# Patient Record
Sex: Female | Born: 1975 | Race: White | Hispanic: No | Marital: Married | Smoking: Former smoker
Health system: Southern US, Community
[De-identification: ages and names within clinical notes are randomized; demographics above are authoritative.]

## PROBLEM LIST (undated history)

## (undated) DIAGNOSIS — M329 Systemic lupus erythematosus, unspecified: Secondary | ICD-10-CM

## (undated) DIAGNOSIS — IMO0002 Reserved for concepts with insufficient information to code with codable children: Secondary | ICD-10-CM

## (undated) DIAGNOSIS — O2230 Deep phlebothrombosis in pregnancy, unspecified trimester: Secondary | ICD-10-CM

## (undated) DIAGNOSIS — A64 Unspecified sexually transmitted disease: Secondary | ICD-10-CM

## (undated) DIAGNOSIS — R51 Headache: Secondary | ICD-10-CM

## (undated) HISTORY — PX: COLPOSCOPY: SHX161

## (undated) HISTORY — DX: Headache: R51

## (undated) HISTORY — DX: Deep phlebothrombosis in pregnancy, unspecified trimester: O22.30

## (undated) HISTORY — DX: Unspecified sexually transmitted disease: A64

---

## 1998-02-10 ENCOUNTER — Other Ambulatory Visit: Admission: RE | Admit: 1998-02-10 | Discharge: 1998-02-10 | Payer: Self-pay | Admitting: Gynecology

## 1999-04-19 ENCOUNTER — Other Ambulatory Visit: Admission: RE | Admit: 1999-04-19 | Discharge: 1999-04-19 | Payer: Self-pay | Admitting: Gynecology

## 2000-01-14 ENCOUNTER — Other Ambulatory Visit: Admission: RE | Admit: 2000-01-14 | Discharge: 2000-01-14 | Payer: Self-pay | Admitting: Obstetrics and Gynecology

## 2000-02-22 ENCOUNTER — Encounter (INDEPENDENT_AMBULATORY_CARE_PROVIDER_SITE_OTHER): Payer: Self-pay | Admitting: Specialist

## 2000-02-22 ENCOUNTER — Other Ambulatory Visit: Admission: RE | Admit: 2000-02-22 | Discharge: 2000-02-22 | Payer: Self-pay | Admitting: Gynecology

## 2000-03-16 ENCOUNTER — Ambulatory Visit (HOSPITAL_COMMUNITY): Admission: RE | Admit: 2000-03-16 | Discharge: 2000-03-16 | Payer: Self-pay | Admitting: Gynecology

## 2000-06-20 ENCOUNTER — Other Ambulatory Visit: Admission: RE | Admit: 2000-06-20 | Discharge: 2000-06-20 | Payer: Self-pay | Admitting: Gynecology

## 2000-09-22 ENCOUNTER — Other Ambulatory Visit: Admission: RE | Admit: 2000-09-22 | Discharge: 2000-09-22 | Payer: Self-pay | Admitting: Gynecology

## 2001-01-17 ENCOUNTER — Other Ambulatory Visit: Admission: RE | Admit: 2001-01-17 | Discharge: 2001-01-17 | Payer: Self-pay | Admitting: Gynecology

## 2001-07-31 ENCOUNTER — Other Ambulatory Visit: Admission: RE | Admit: 2001-07-31 | Discharge: 2001-07-31 | Payer: Self-pay | Admitting: Gynecology

## 2001-12-12 ENCOUNTER — Emergency Department (HOSPITAL_COMMUNITY): Admission: EM | Admit: 2001-12-12 | Discharge: 2001-12-12 | Payer: Self-pay | Admitting: Emergency Medicine

## 2002-09-27 ENCOUNTER — Other Ambulatory Visit: Admission: RE | Admit: 2002-09-27 | Discharge: 2002-09-27 | Payer: Self-pay | Admitting: Gynecology

## 2003-05-23 ENCOUNTER — Other Ambulatory Visit: Admission: RE | Admit: 2003-05-23 | Discharge: 2003-05-23 | Payer: Self-pay | Admitting: Gynecology

## 2003-06-09 ENCOUNTER — Emergency Department (HOSPITAL_COMMUNITY): Admission: EM | Admit: 2003-06-09 | Discharge: 2003-06-09 | Payer: Self-pay | Admitting: Emergency Medicine

## 2003-10-20 ENCOUNTER — Other Ambulatory Visit: Admission: RE | Admit: 2003-10-20 | Discharge: 2003-10-20 | Payer: Self-pay | Admitting: Gynecology

## 2004-02-20 ENCOUNTER — Other Ambulatory Visit: Admission: RE | Admit: 2004-02-20 | Discharge: 2004-02-20 | Payer: Self-pay | Admitting: Gynecology

## 2004-08-23 ENCOUNTER — Inpatient Hospital Stay (HOSPITAL_COMMUNITY): Admission: AD | Admit: 2004-08-23 | Discharge: 2004-08-26 | Payer: Self-pay | Admitting: Gynecology

## 2004-10-06 ENCOUNTER — Encounter (INDEPENDENT_AMBULATORY_CARE_PROVIDER_SITE_OTHER): Payer: Self-pay | Admitting: Specialist

## 2004-10-06 ENCOUNTER — Inpatient Hospital Stay (HOSPITAL_COMMUNITY): Admission: RE | Admit: 2004-10-06 | Discharge: 2004-10-09 | Payer: Self-pay | Admitting: Gynecology

## 2004-11-29 ENCOUNTER — Other Ambulatory Visit: Admission: RE | Admit: 2004-11-29 | Discharge: 2004-11-29 | Payer: Self-pay | Admitting: Gynecology

## 2004-11-30 ENCOUNTER — Ambulatory Visit: Payer: Self-pay | Admitting: Oncology

## 2005-02-15 ENCOUNTER — Ambulatory Visit: Payer: Self-pay | Admitting: Oncology

## 2005-04-05 ENCOUNTER — Ambulatory Visit: Payer: Self-pay | Admitting: Oncology

## 2005-05-18 ENCOUNTER — Ambulatory Visit: Payer: Self-pay | Admitting: Oncology

## 2005-05-18 LAB — CBC WITH DIFFERENTIAL/PLATELET
Basophils Absolute: 0 10*3/uL (ref 0.0–0.1)
EOS%: 2.2 % (ref 0.0–7.0)
Eosinophils Absolute: 0.2 10*3/uL (ref 0.0–0.5)
HGB: 11.8 g/dL (ref 11.6–15.9)
MCH: 27.4 pg (ref 26.0–34.0)
NEUT#: 5.9 10*3/uL (ref 1.5–6.5)
RDW: 14.6 % — ABNORMAL HIGH (ref 11.3–14.5)
lymph#: 1.3 10*3/uL (ref 0.9–3.3)

## 2005-05-24 ENCOUNTER — Inpatient Hospital Stay (HOSPITAL_COMMUNITY): Admission: EM | Admit: 2005-05-24 | Discharge: 2005-05-26 | Payer: Self-pay | Admitting: Emergency Medicine

## 2005-05-24 ENCOUNTER — Encounter (INDEPENDENT_AMBULATORY_CARE_PROVIDER_SITE_OTHER): Payer: Self-pay | Admitting: Cardiology

## 2005-05-25 ENCOUNTER — Ambulatory Visit: Payer: Self-pay | Admitting: Oncology

## 2005-06-01 ENCOUNTER — Ambulatory Visit: Payer: Self-pay | Admitting: Internal Medicine

## 2005-06-09 ENCOUNTER — Ambulatory Visit: Payer: Self-pay | Admitting: Family Medicine

## 2005-06-09 ENCOUNTER — Ambulatory Visit: Payer: Self-pay | Admitting: Cardiovascular Disease

## 2005-06-09 ENCOUNTER — Encounter: Admission: RE | Admit: 2005-06-09 | Discharge: 2005-06-09 | Payer: Self-pay | Admitting: Cardiovascular Disease

## 2005-06-10 ENCOUNTER — Ambulatory Visit: Payer: Self-pay | Admitting: Family Medicine

## 2005-06-17 LAB — PROTHROMBIN TIME
INR: 5 — ABNORMAL HIGH (ref 0.0–1.5)
Prothrombin Time: 46.2 seconds — ABNORMAL HIGH (ref 11.6–15.2)

## 2005-06-17 LAB — PROTIME-INR

## 2005-06-22 LAB — CHROMOGENIC FACTOR X: CHROM XA: 19.2 %

## 2005-07-05 ENCOUNTER — Ambulatory Visit: Payer: Self-pay | Admitting: Oncology

## 2005-07-05 LAB — CBC WITH DIFFERENTIAL/PLATELET
Basophils Absolute: 0.1 10*3/uL (ref 0.0–0.1)
Eosinophils Absolute: 0.1 10*3/uL (ref 0.0–0.5)
HCT: 36.6 % (ref 34.8–46.6)
HGB: 11.8 g/dL (ref 11.6–15.9)
MONO#: 0.6 10*3/uL (ref 0.1–0.9)
NEUT%: 84 % — ABNORMAL HIGH (ref 39.6–76.8)
WBC: 14 10*3/uL — ABNORMAL HIGH (ref 3.9–10.0)
lymph#: 1.5 10*3/uL (ref 0.9–3.3)

## 2005-07-05 LAB — PROTIME-INR

## 2005-07-11 LAB — CHROMOGENIC FACTOR X: CHROM XA: 26.4 %

## 2005-07-29 LAB — CBC WITH DIFFERENTIAL/PLATELET
BASO%: 0.6 % (ref 0.0–2.0)
Basophils Absolute: 0 10*3/uL (ref 0.0–0.1)
Eosinophils Absolute: 0.1 10*3/uL (ref 0.0–0.5)
HCT: 36.4 % (ref 34.8–46.6)
HGB: 12.2 g/dL (ref 11.6–15.9)
LYMPH%: 23.9 % (ref 14.0–48.0)
MCHC: 33.5 g/dL (ref 32.0–36.0)
MONO#: 0.6 10*3/uL (ref 0.1–0.9)
NEUT%: 66.7 % (ref 39.6–76.8)
Platelets: 445 10*3/uL — ABNORMAL HIGH (ref 145–400)
WBC: 8.4 10*3/uL (ref 3.9–10.0)
lymph#: 2 10*3/uL (ref 0.9–3.3)

## 2005-08-05 LAB — PROTIME-INR

## 2005-08-09 LAB — PROTIME-INR
INR: 1.8 — ABNORMAL LOW (ref 2.00–3.50)
Protime: 16.5 Seconds — ABNORMAL HIGH (ref 10.6–13.4)

## 2005-08-22 ENCOUNTER — Ambulatory Visit: Payer: Self-pay | Admitting: Oncology

## 2005-08-22 LAB — PROTIME-INR

## 2005-08-23 LAB — CHROMOGENIC FACTOR X: CHROM XA: 27.9 %

## 2005-09-09 ENCOUNTER — Ambulatory Visit: Payer: Self-pay | Admitting: Internal Medicine

## 2005-09-14 LAB — CHROMOGENIC FACTOR X: CHROM XA: 42.2 %

## 2005-09-27 ENCOUNTER — Ambulatory Visit: Payer: Self-pay | Admitting: Internal Medicine

## 2005-10-05 ENCOUNTER — Ambulatory Visit: Payer: Self-pay | Admitting: Oncology

## 2005-10-07 LAB — CHROMOGENIC FACTOR X: CHROM XA: 44.3 %

## 2005-10-24 LAB — CHROMOGENIC FACTOR X: CHROM XA: 44.6 %

## 2005-11-01 LAB — CBC WITH DIFFERENTIAL/PLATELET
BASO%: 0.5 % (ref 0.0–2.0)
Basophils Absolute: 0.1 10*3/uL (ref 0.0–0.1)
EOS%: 0.9 % (ref 0.0–7.0)
HCT: 38.8 % (ref 34.8–46.6)
HGB: 13 g/dL (ref 11.6–15.9)
MCH: 27.4 pg (ref 26.0–34.0)
MCHC: 33.5 g/dL (ref 32.0–36.0)
MONO#: 0.5 10*3/uL (ref 0.1–0.9)
NEUT%: 82.4 % — ABNORMAL HIGH (ref 39.6–76.8)
RDW: 15 % — ABNORMAL HIGH (ref 11.3–14.5)
WBC: 10.8 10*3/uL — ABNORMAL HIGH (ref 3.9–10.0)
lymph#: 1.2 10*3/uL (ref 0.9–3.3)

## 2005-11-24 ENCOUNTER — Ambulatory Visit: Payer: Self-pay | Admitting: Oncology

## 2005-12-19 LAB — PROTHROMBIN TIME: Prothrombin Time: 31.1 seconds — ABNORMAL HIGH (ref 11.6–15.2)

## 2005-12-20 LAB — CHROMOGENIC FACTOR X: CHROM XA: 19.6 % — ABNORMAL LOW (ref 86–146)

## 2005-12-21 ENCOUNTER — Other Ambulatory Visit: Admission: RE | Admit: 2005-12-21 | Discharge: 2005-12-21 | Payer: Self-pay | Admitting: Gynecology

## 2005-12-26 ENCOUNTER — Ambulatory Visit: Payer: Self-pay

## 2005-12-26 ENCOUNTER — Encounter: Payer: Self-pay | Admitting: Internal Medicine

## 2005-12-27 ENCOUNTER — Ambulatory Visit: Payer: Self-pay | Admitting: Internal Medicine

## 2006-01-02 LAB — PROTIME-INR

## 2006-01-03 ENCOUNTER — Ambulatory Visit: Payer: Self-pay

## 2006-01-05 ENCOUNTER — Ambulatory Visit: Payer: Self-pay

## 2006-01-12 ENCOUNTER — Ambulatory Visit: Payer: Self-pay | Admitting: Oncology

## 2006-01-16 LAB — PROTHROMBIN TIME: INR: 2.1 — ABNORMAL HIGH (ref 0.0–1.5)

## 2006-01-17 LAB — CHROMOGENIC FACTOR X: CHROM XA: 24.4 % — ABNORMAL LOW (ref 86–146)

## 2006-02-09 LAB — CHROMOGENIC FACTOR X: CHROM XA: 28.1 % — ABNORMAL LOW (ref 86–146)

## 2006-02-17 LAB — PROTHROMBIN TIME
INR: 1.9 — ABNORMAL HIGH (ref 0.0–1.5)
Prothrombin Time: 23 seconds — ABNORMAL HIGH (ref 11.6–15.2)

## 2006-02-20 LAB — CHROMOGENIC FACTOR X: CHROM XA: 32.7 % — ABNORMAL LOW (ref 86–146)

## 2006-03-07 ENCOUNTER — Ambulatory Visit: Payer: Self-pay | Admitting: Oncology

## 2006-03-10 LAB — PROTHROMBIN TIME: INR: 2.8 — ABNORMAL HIGH (ref 0.0–1.5)

## 2006-03-10 LAB — CHROMOGENIC FACTOR X: CHROM XA: 21.1 % — ABNORMAL LOW (ref 86–146)

## 2006-04-07 LAB — CBC WITH DIFFERENTIAL/PLATELET
BASO%: 0.6 % (ref 0.0–2.0)
LYMPH%: 26.7 % (ref 14.0–48.0)
MCHC: 34.3 g/dL (ref 32.0–36.0)
MONO#: 0.4 10*3/uL (ref 0.1–0.9)
Platelets: 317 10*3/uL (ref 145–400)
RBC: 4.69 10*6/uL (ref 3.70–5.32)
WBC: 5.9 10*3/uL (ref 3.9–10.0)
lymph#: 1.6 10*3/uL (ref 0.9–3.3)

## 2006-04-07 LAB — PROTIME-INR: Protime: 26.4 Seconds — ABNORMAL HIGH (ref 10.6–13.4)

## 2006-04-11 LAB — CHROMOGENIC FACTOR X: CHROM XA: 30 % — ABNORMAL LOW (ref 86–146)

## 2006-05-03 ENCOUNTER — Ambulatory Visit: Payer: Self-pay | Admitting: Oncology

## 2006-05-09 LAB — CBC WITH DIFFERENTIAL/PLATELET
BASO%: 0.5 % (ref 0.0–2.0)
EOS%: 3 % (ref 0.0–7.0)
HCT: 36.1 % (ref 34.8–46.6)
LYMPH%: 24.5 % (ref 14.0–48.0)
MCH: 28.4 pg (ref 26.0–34.0)
MCHC: 34.4 g/dL (ref 32.0–36.0)
MCV: 82.5 fL (ref 81.0–101.0)
MONO%: 9 % (ref 0.0–13.0)
NEUT%: 63 % (ref 39.6–76.8)
Platelets: 331 10*3/uL (ref 145–400)
lymph#: 1.3 10*3/uL (ref 0.9–3.3)

## 2006-05-09 LAB — PROTIME-INR
INR: 3.5 (ref 2.00–3.50)
Protime: 42 s — ABNORMAL HIGH (ref 10.6–13.4)

## 2006-05-09 LAB — CHROMOGENIC FACTOR X: CHROM XA: 22.7 % — ABNORMAL LOW (ref 86–146)

## 2006-05-23 LAB — PROTIME-INR

## 2006-05-30 LAB — CHROMOGENIC FACTOR X: CHROM XA: 30 % — ABNORMAL LOW (ref 86–146)

## 2006-06-08 LAB — CBC WITH DIFFERENTIAL/PLATELET
BASO%: 0.4 % (ref 0.0–2.0)
Basophils Absolute: 0 10*3/uL (ref 0.0–0.1)
EOS%: 1.9 % (ref 0.0–7.0)
HCT: 34 % — ABNORMAL LOW (ref 34.8–46.6)
HGB: 11.9 g/dL (ref 11.6–15.9)
MCH: 29 pg (ref 26.0–34.0)
MONO#: 0.6 10*3/uL (ref 0.1–0.9)
RDW: 14.3 % (ref 11.3–14.5)
WBC: 6.5 10*3/uL (ref 3.9–10.0)
lymph#: 1.6 10*3/uL (ref 0.9–3.3)

## 2006-06-08 LAB — PROTIME-INR: INR: 2.6 (ref 2.00–3.50)

## 2006-07-05 ENCOUNTER — Ambulatory Visit: Payer: Self-pay | Admitting: Oncology

## 2006-07-07 LAB — CBC WITH DIFFERENTIAL/PLATELET
Eosinophils Absolute: 0.1 10*3/uL (ref 0.0–0.5)
HCT: 35.4 % (ref 34.8–46.6)
LYMPH%: 21.7 % (ref 14.0–48.0)
MCHC: 35.1 g/dL (ref 32.0–36.0)
MCV: 83.5 fL (ref 81.0–101.0)
MONO#: 0.4 10*3/uL (ref 0.1–0.9)
MONO%: 6.9 % (ref 0.0–13.0)
NEUT%: 68.6 % (ref 39.6–76.8)
Platelets: 327 10*3/uL (ref 145–400)
RBC: 4.24 10*6/uL (ref 3.70–5.32)
WBC: 6 10*3/uL (ref 3.9–10.0)

## 2006-07-07 LAB — PROTIME-INR: Protime: 48 Seconds — ABNORMAL HIGH (ref 10.6–13.4)

## 2006-07-19 LAB — PROTIME-INR
INR: 2.8 (ref 2.00–3.50)
Protime: 33.6 Seconds — ABNORMAL HIGH (ref 10.6–13.4)

## 2006-08-14 ENCOUNTER — Ambulatory Visit: Payer: Self-pay | Admitting: Oncology

## 2006-08-16 LAB — CBC WITH DIFFERENTIAL/PLATELET
Basophils Absolute: 0.1 10*3/uL (ref 0.0–0.1)
Eosinophils Absolute: 0.1 10*3/uL (ref 0.0–0.5)
HGB: 12.6 g/dL (ref 11.6–15.9)
MONO#: 0.7 10*3/uL (ref 0.1–0.9)
NEUT#: 7.1 10*3/uL — ABNORMAL HIGH (ref 1.5–6.5)
RBC: 4.23 10*6/uL (ref 3.70–5.32)
RDW: 11 % — ABNORMAL LOW (ref 11.3–14.5)
WBC: 9.5 10*3/uL (ref 3.9–10.0)
lymph#: 1.5 10*3/uL (ref 0.9–3.3)

## 2006-08-16 LAB — PROTIME-INR
INR: 1.9 — ABNORMAL LOW (ref 2.00–3.50)
Protime: 22.8 Seconds — ABNORMAL HIGH (ref 10.6–13.4)

## 2006-11-02 ENCOUNTER — Encounter: Payer: Self-pay | Admitting: Internal Medicine

## 2006-11-22 ENCOUNTER — Ambulatory Visit: Payer: Self-pay | Admitting: Oncology

## 2007-01-08 ENCOUNTER — Other Ambulatory Visit: Admission: RE | Admit: 2007-01-08 | Discharge: 2007-01-08 | Payer: Self-pay | Admitting: Gynecology

## 2007-02-02 ENCOUNTER — Ambulatory Visit: Payer: Self-pay | Admitting: Oncology

## 2007-02-19 LAB — CBC WITH DIFFERENTIAL/PLATELET
Basophils Absolute: 0 10*3/uL (ref 0.0–0.1)
EOS%: 0.5 % (ref 0.0–7.0)
HGB: 12.5 g/dL (ref 11.6–15.9)
LYMPH%: 8.4 % — ABNORMAL LOW (ref 14.0–48.0)
MCH: 29 pg (ref 26.0–34.0)
MCV: 84.7 fL (ref 81.0–101.0)
MONO%: 3.5 % (ref 0.0–13.0)
RBC: 4.31 10*6/uL (ref 3.70–5.32)
RDW: 12.8 % (ref 11.3–14.5)

## 2007-02-19 LAB — PROTHROMBIN TIME
INR: 3.6 — ABNORMAL HIGH (ref 0.0–1.5)
Prothrombin Time: 36.9 seconds — ABNORMAL HIGH (ref 11.6–15.2)

## 2007-02-19 LAB — COMPREHENSIVE METABOLIC PANEL
Alkaline Phosphatase: 51 U/L (ref 39–117)
Creatinine, Ser: 0.9 mg/dL (ref 0.40–1.20)
Glucose, Bld: 124 mg/dL — ABNORMAL HIGH (ref 70–99)
Sodium: 140 mEq/L (ref 135–145)
Total Bilirubin: 0.5 mg/dL (ref 0.3–1.2)
Total Protein: 7.4 g/dL (ref 6.0–8.3)

## 2007-08-08 ENCOUNTER — Ambulatory Visit: Payer: Self-pay | Admitting: Oncology

## 2007-10-29 ENCOUNTER — Ambulatory Visit: Payer: Self-pay | Admitting: Internal Medicine

## 2007-10-29 DIAGNOSIS — I319 Disease of pericardium, unspecified: Secondary | ICD-10-CM | POA: Insufficient documentation

## 2007-10-29 DIAGNOSIS — R609 Edema, unspecified: Secondary | ICD-10-CM | POA: Insufficient documentation

## 2007-10-29 DIAGNOSIS — Z8672 Personal history of thrombophlebitis: Secondary | ICD-10-CM | POA: Insufficient documentation

## 2007-10-29 DIAGNOSIS — M329 Systemic lupus erythematosus, unspecified: Secondary | ICD-10-CM | POA: Insufficient documentation

## 2007-11-08 ENCOUNTER — Ambulatory Visit: Payer: Self-pay | Admitting: Internal Medicine

## 2008-01-21 ENCOUNTER — Ambulatory Visit: Payer: Self-pay | Admitting: Women's Health

## 2008-01-21 ENCOUNTER — Encounter: Payer: Self-pay | Admitting: Women's Health

## 2008-01-21 ENCOUNTER — Other Ambulatory Visit: Admission: RE | Admit: 2008-01-21 | Discharge: 2008-01-21 | Payer: Self-pay | Admitting: Gynecology

## 2008-01-22 ENCOUNTER — Encounter: Payer: Self-pay | Admitting: Internal Medicine

## 2008-02-21 ENCOUNTER — Ambulatory Visit: Payer: Self-pay | Admitting: Oncology

## 2008-03-19 ENCOUNTER — Ambulatory Visit: Payer: Self-pay | Admitting: Women's Health

## 2008-03-27 ENCOUNTER — Ambulatory Visit: Payer: Self-pay | Admitting: Gynecology

## 2008-03-27 ENCOUNTER — Encounter: Payer: Self-pay | Admitting: Gynecology

## 2008-05-16 ENCOUNTER — Emergency Department (HOSPITAL_COMMUNITY): Admission: EM | Admit: 2008-05-16 | Discharge: 2008-05-17 | Payer: Self-pay | Admitting: Emergency Medicine

## 2008-05-19 ENCOUNTER — Ambulatory Visit: Payer: Self-pay | Admitting: Family Medicine

## 2008-05-19 DIAGNOSIS — L02419 Cutaneous abscess of limb, unspecified: Secondary | ICD-10-CM | POA: Insufficient documentation

## 2008-05-19 DIAGNOSIS — L03119 Cellulitis of unspecified part of limb: Secondary | ICD-10-CM

## 2008-05-22 ENCOUNTER — Ambulatory Visit: Payer: Self-pay | Admitting: Family Medicine

## 2008-05-26 ENCOUNTER — Ambulatory Visit: Payer: Self-pay | Admitting: Family Medicine

## 2008-06-09 ENCOUNTER — Ambulatory Visit: Payer: Self-pay | Admitting: Oncology

## 2008-06-17 LAB — PROTIME-INR: INR: 1.7 — ABNORMAL LOW (ref 2.00–3.50)

## 2008-07-16 LAB — PROTIME-INR
INR: 4.3 — ABNORMAL HIGH (ref 2.00–3.50)
Protime: 51.6 Seconds — ABNORMAL HIGH (ref 10.6–13.4)

## 2008-08-21 ENCOUNTER — Ambulatory Visit: Payer: Self-pay | Admitting: Oncology

## 2008-09-11 LAB — COMPREHENSIVE METABOLIC PANEL
Albumin: 4 g/dL (ref 3.5–5.2)
BUN: 10 mg/dL (ref 6–23)
CO2: 25 mEq/L (ref 19–32)
Calcium: 9.1 mg/dL (ref 8.4–10.5)
Chloride: 103 mEq/L (ref 96–112)
Glucose, Bld: 90 mg/dL (ref 70–99)
Potassium: 4.5 mEq/L (ref 3.5–5.3)

## 2008-09-11 LAB — CBC WITH DIFFERENTIAL/PLATELET
Basophils Absolute: 0 10*3/uL (ref 0.0–0.1)
Eosinophils Absolute: 0 10*3/uL (ref 0.0–0.5)
HCT: 37.9 % (ref 34.8–46.6)
HGB: 12.8 g/dL (ref 11.6–15.9)
LYMPH%: 8.8 % — ABNORMAL LOW (ref 14.0–49.7)
MCHC: 33.7 g/dL (ref 31.5–36.0)
MONO#: 0.5 10*3/uL (ref 0.1–0.9)
NEUT#: 8.5 10*3/uL — ABNORMAL HIGH (ref 1.5–6.5)
NEUT%: 85.4 % — ABNORMAL HIGH (ref 38.4–76.8)
Platelets: 340 10*3/uL (ref 145–400)
WBC: 9.9 10*3/uL (ref 3.9–10.3)

## 2008-09-11 LAB — ERYTHROCYTE SEDIMENTATION RATE: Sed Rate: 33 mm/hr — ABNORMAL HIGH (ref 0–30)

## 2008-09-11 LAB — PROTIME-INR: Protime: 31.2 Seconds — ABNORMAL HIGH (ref 10.6–13.4)

## 2008-09-11 LAB — LACTATE DEHYDROGENASE: LDH: 191 U/L (ref 94–250)

## 2008-09-11 LAB — MORPHOLOGY: PLT EST: ADEQUATE

## 2008-10-01 ENCOUNTER — Encounter: Payer: Self-pay | Admitting: Internal Medicine

## 2008-11-24 ENCOUNTER — Ambulatory Visit: Payer: Self-pay | Admitting: Oncology

## 2008-11-24 LAB — CBC WITH DIFFERENTIAL/PLATELET
Eosinophils Absolute: 0.1 10*3/uL (ref 0.0–0.5)
LYMPH%: 23.4 % (ref 14.0–49.7)
MCV: 87.7 fL (ref 79.5–101.0)
MONO%: 8.2 % (ref 0.0–14.0)
NEUT#: 5.4 10*3/uL (ref 1.5–6.5)
NEUT%: 66.7 % (ref 38.4–76.8)
Platelets: 317 10*3/uL (ref 145–400)
RBC: 4.16 10*6/uL (ref 3.70–5.45)
nRBC: 0 % (ref 0–0)

## 2008-11-24 LAB — PROTIME-INR: Protime: 22.8 Seconds — ABNORMAL HIGH (ref 10.6–13.4)

## 2009-01-16 ENCOUNTER — Ambulatory Visit: Payer: Self-pay | Admitting: Oncology

## 2009-01-20 LAB — PROTIME-INR: Protime: 20.4 Seconds — ABNORMAL HIGH (ref 10.6–13.4)

## 2009-01-28 LAB — PROTIME-INR
INR: 2 (ref 2.00–3.50)
Protime: 24 Seconds — ABNORMAL HIGH (ref 10.6–13.4)

## 2009-02-04 ENCOUNTER — Ambulatory Visit: Payer: Self-pay | Admitting: Women's Health

## 2009-02-04 ENCOUNTER — Other Ambulatory Visit: Admission: RE | Admit: 2009-02-04 | Discharge: 2009-02-04 | Payer: Self-pay | Admitting: Gynecology

## 2009-03-06 ENCOUNTER — Ambulatory Visit: Payer: Self-pay | Admitting: Oncology

## 2009-03-10 LAB — CBC WITH DIFFERENTIAL/PLATELET
BASO%: 0.6 % (ref 0.0–2.0)
Basophils Absolute: 0.1 10*3/uL (ref 0.0–0.1)
EOS%: 0.8 % (ref 0.0–7.0)
Eosinophils Absolute: 0.1 10*3/uL (ref 0.0–0.5)
HCT: 37.2 % (ref 34.8–46.6)
HGB: 12.4 g/dL (ref 11.6–15.9)
MCV: 86.9 fL (ref 79.5–101.0)
MONO%: 5.7 % (ref 0.0–14.0)
Platelets: 351 10*3/uL (ref 145–400)
RBC: 4.28 10*6/uL (ref 3.70–5.45)
RDW: 13.3 % (ref 11.2–14.5)
lymph#: 1.2 10*3/uL (ref 0.9–3.3)

## 2009-03-10 LAB — PROTIME-INR: Protime: 43.2 Seconds — ABNORMAL HIGH (ref 10.6–13.4)

## 2009-04-01 LAB — CHROMOGENIC FACTOR X: CHROM XA: 16.3 % — ABNORMAL LOW (ref 86–146)

## 2009-06-09 ENCOUNTER — Ambulatory Visit: Payer: Self-pay | Admitting: Family Medicine

## 2009-06-12 ENCOUNTER — Ambulatory Visit: Payer: Self-pay | Admitting: Oncology

## 2009-06-18 ENCOUNTER — Ambulatory Visit: Payer: Self-pay | Admitting: Women's Health

## 2009-06-18 LAB — CBC WITH DIFFERENTIAL/PLATELET
BASO%: 0.6 % (ref 0.0–2.0)
Basophils Absolute: 0 10*3/uL (ref 0.0–0.1)
HCT: 35.4 % (ref 34.8–46.6)
HGB: 12.2 g/dL (ref 11.6–15.9)
LYMPH%: 17.1 % (ref 14.0–49.7)
MONO#: 0.5 10*3/uL (ref 0.1–0.9)
MONO%: 7.2 % (ref 0.0–14.0)
NEUT%: 73.9 % (ref 38.4–76.8)

## 2009-06-18 LAB — PROTIME-INR: INR: 3.9 — ABNORMAL HIGH (ref 2.00–3.50)

## 2009-07-07 LAB — PROTIME-INR: INR: 3.8 — ABNORMAL HIGH (ref 2.00–3.50)

## 2009-07-15 ENCOUNTER — Ambulatory Visit: Payer: Self-pay | Admitting: Oncology

## 2009-09-04 ENCOUNTER — Ambulatory Visit: Payer: Self-pay | Admitting: Oncology

## 2009-09-08 LAB — CBC WITH DIFFERENTIAL/PLATELET
Basophils Absolute: 0 10*3/uL (ref 0.0–0.1)
EOS%: 2.5 % (ref 0.0–7.0)
HCT: 36.8 % (ref 34.8–46.6)
HGB: 12.2 g/dL (ref 11.6–15.9)
MCH: 29 pg (ref 25.1–34.0)
MCHC: 33.2 g/dL (ref 31.5–36.0)
MCV: 87.6 fL (ref 79.5–101.0)
MONO%: 9.3 % (ref 0.0–14.0)
NEUT#: 5 10*3/uL (ref 1.5–6.5)
RDW: 13.1 % (ref 11.2–14.5)
WBC: 7.3 10*3/uL (ref 3.9–10.3)
lymph#: 1.5 10*3/uL (ref 0.9–3.3)

## 2009-09-08 LAB — PROTIME-INR: INR: 2.1 (ref 2.00–3.50)

## 2009-09-12 ENCOUNTER — Emergency Department (HOSPITAL_COMMUNITY): Admission: EM | Admit: 2009-09-12 | Discharge: 2009-09-12 | Payer: Self-pay | Admitting: Emergency Medicine

## 2009-11-23 ENCOUNTER — Encounter: Admission: RE | Admit: 2009-11-23 | Discharge: 2009-11-23 | Payer: Self-pay | Admitting: Internal Medicine

## 2009-12-28 ENCOUNTER — Ambulatory Visit: Payer: Self-pay | Admitting: Oncology

## 2009-12-28 LAB — PROTIME-INR: Protime: 20.4 Seconds — ABNORMAL HIGH (ref 10.6–13.4)

## 2010-01-06 ENCOUNTER — Ambulatory Visit: Payer: Self-pay | Admitting: Women's Health

## 2010-01-19 LAB — PROTIME-INR: Protime: 24 Seconds — ABNORMAL HIGH (ref 10.6–13.4)

## 2010-02-17 ENCOUNTER — Ambulatory Visit: Payer: Self-pay | Admitting: Oncology

## 2010-02-17 LAB — PROTIME-INR
INR: 2.3 (ref 2.00–3.50)
Protime: 27.6 Seconds — ABNORMAL HIGH (ref 10.6–13.4)

## 2010-03-03 ENCOUNTER — Ambulatory Visit
Admission: RE | Admit: 2010-03-03 | Discharge: 2010-03-03 | Payer: Self-pay | Source: Home / Self Care | Attending: Women's Health | Admitting: Women's Health

## 2010-03-03 ENCOUNTER — Other Ambulatory Visit
Admission: RE | Admit: 2010-03-03 | Discharge: 2010-03-03 | Payer: Self-pay | Source: Home / Self Care | Admitting: Gynecology

## 2010-03-16 ENCOUNTER — Encounter (HOSPITAL_BASED_OUTPATIENT_CLINIC_OR_DEPARTMENT_OTHER): Payer: Self-pay | Admitting: Oncology

## 2010-03-16 ENCOUNTER — Other Ambulatory Visit: Payer: Self-pay | Admitting: Oncology

## 2010-03-16 DIAGNOSIS — Z7901 Long term (current) use of anticoagulants: Secondary | ICD-10-CM

## 2010-03-16 DIAGNOSIS — Z86718 Personal history of other venous thrombosis and embolism: Secondary | ICD-10-CM

## 2010-03-16 DIAGNOSIS — M329 Systemic lupus erythematosus, unspecified: Secondary | ICD-10-CM

## 2010-03-16 LAB — CBC WITH DIFFERENTIAL/PLATELET
BASO%: 0.4 % (ref 0.0–2.0)
Basophils Absolute: 0.1 10*3/uL (ref 0.0–0.1)
LYMPH%: 9.6 % — ABNORMAL LOW (ref 14.0–49.7)
MCH: 29.1 pg (ref 25.1–34.0)
MCHC: 33.6 g/dL (ref 31.5–36.0)
MCV: 86.7 fL (ref 79.5–101.0)
MONO#: 0.7 10*3/uL (ref 0.1–0.9)
MONO%: 5.6 % (ref 0.0–14.0)
NEUT#: 10.8 10*3/uL — ABNORMAL HIGH (ref 1.5–6.5)
NEUT%: 83.5 % — ABNORMAL HIGH (ref 38.4–76.8)
WBC: 12.9 10*3/uL — ABNORMAL HIGH (ref 3.9–10.3)
lymph#: 1.2 10*3/uL (ref 0.9–3.3)

## 2010-04-09 ENCOUNTER — Encounter (HOSPITAL_BASED_OUTPATIENT_CLINIC_OR_DEPARTMENT_OTHER): Payer: Self-pay | Admitting: Oncology

## 2010-04-09 ENCOUNTER — Other Ambulatory Visit: Payer: Self-pay | Admitting: Oncology

## 2010-04-09 DIAGNOSIS — M329 Systemic lupus erythematosus, unspecified: Secondary | ICD-10-CM

## 2010-04-09 DIAGNOSIS — Z7901 Long term (current) use of anticoagulants: Secondary | ICD-10-CM

## 2010-04-09 DIAGNOSIS — Z86718 Personal history of other venous thrombosis and embolism: Secondary | ICD-10-CM

## 2010-04-09 LAB — PROTIME-INR: Protime: 20.4 Seconds — ABNORMAL HIGH (ref 10.6–13.4)

## 2010-04-21 ENCOUNTER — Other Ambulatory Visit: Payer: Self-pay | Admitting: Oncology

## 2010-04-21 ENCOUNTER — Encounter (HOSPITAL_BASED_OUTPATIENT_CLINIC_OR_DEPARTMENT_OTHER): Payer: Self-pay | Admitting: Oncology

## 2010-04-21 DIAGNOSIS — M329 Systemic lupus erythematosus, unspecified: Secondary | ICD-10-CM

## 2010-04-21 DIAGNOSIS — Z86718 Personal history of other venous thrombosis and embolism: Secondary | ICD-10-CM

## 2010-04-21 DIAGNOSIS — Z7901 Long term (current) use of anticoagulants: Secondary | ICD-10-CM

## 2010-04-21 LAB — PROTIME-INR: INR: 2.1 (ref 2.00–3.50)

## 2010-04-23 LAB — BASIC METABOLIC PANEL
CO2: 27 mEq/L (ref 19–32)
Creatinine, Ser: 0.78 mg/dL (ref 0.4–1.2)
Glucose, Bld: 96 mg/dL (ref 70–99)
Potassium: 3.5 mEq/L (ref 3.5–5.1)
Sodium: 137 mEq/L (ref 135–145)

## 2010-04-23 LAB — DIFFERENTIAL
Basophils Relative: 1 % (ref 0–1)
Eosinophils Relative: 1 % (ref 0–5)
Lymphs Abs: 1.3 10*3/uL (ref 0.7–4.0)
Monocytes Absolute: 0.6 10*3/uL (ref 0.1–1.0)
Monocytes Relative: 7 % (ref 3–12)
Neutro Abs: 7.1 10*3/uL (ref 1.7–7.7)
Neutrophils Relative %: 77 % (ref 43–77)

## 2010-04-23 LAB — POCT PREGNANCY, URINE: Preg Test, Ur: NEGATIVE

## 2010-04-23 LAB — URINALYSIS, ROUTINE W REFLEX MICROSCOPIC
Bilirubin Urine: NEGATIVE
Leukocytes, UA: NEGATIVE
Nitrite: NEGATIVE
Urobilinogen, UA: 0.2 mg/dL (ref 0.0–1.0)
pH: 6 (ref 5.0–8.0)

## 2010-04-23 LAB — URINE MICROSCOPIC-ADD ON

## 2010-04-23 LAB — CBC
HCT: 36.8 % (ref 36.0–46.0)
Hemoglobin: 12.9 g/dL (ref 12.0–15.0)
RBC: 4.23 MIL/uL (ref 3.87–5.11)
WBC: 9.2 10*3/uL (ref 4.0–10.5)

## 2010-06-25 NOTE — Discharge Summary (Signed)
[name], [name]               ACCOUNT NO.:  [id number]   MEDICAL RECORD NO.:  [id number]          PATIENT TYPE:  INP   LOCATION:  9151                          FACILITY:  [facility]   PHYSICIAN:  Timothy P. Fontaine, M.D.DATE OF BIRTH:  09/11/1975   DATE OF ADMISSION:  08/23/2004  DATE OF DISCHARGE:  08/26/2004                                 DISCHARGE SUMMARY   DISCHARGE DIAGNOSES:  1.  Pregnancy at 32 weeks.  2.  Lupus erythematosus.  3.  Left lower calf deep venous thrombosis.   PROCEDURE:  None.   HOSPITAL COURSE:  A 35 year old G1, P0 female [redacted] weeks gestation admitted  with a deep venous thrombosis of the left lower leg per Doppler ultrasound  study.  She had been receiving Lovenox 40 mg daily for preventative  prophylaxis and experienced a deep venous thrombosis.  She was begun on a  full anticoagulation dose at 1 mg/kg which was 120 mg b.i.d. and the day  following anticoagulation a heparin level was done which was 1.05 which was  within the therapeutic range.  The patient's laboratory values otherwise to  include PT, PTT, CBC, comprehensive metabolic panel were all normal.  Patient also had an ultrasound with estimated fetal weight in the 75th-90th  percentile, cephalic presentation, normal AFI, no placental abnormalities.  The cervix was 3.5 cm, closed.  The patient's left leg swelling and  tenderness was improved at the time of discharge and she was discharged home  to continue on Lovenox 120 mg b.i.d.  She was also given a prescription for  Ambien CR 12.5 #20 one p.o. q.h.s. p.r.n. insomnia.  She received  precautions for ASAP follow-up and then otherwise has an appointment to be  seen in the office four days following discharge for antepartum testing,  ultrasound follow-up, and a repeat heparin level.       TPF/MEDQ  D:  08/26/2004  T:  08/26/2004  Job:  [id number]

## 2010-06-25 NOTE — Op Note (Signed)
[facility]  Patient:    [name], [name]                       MRN: [id number] Proc. Date: 03/16/00 Adm. Date:  [id number] Disc. Date: [id number] Attending:  Douglass Rivers                           Operative Report  PREOPERATIVE DIAGNOSIS:       Extensive condylomata, which failed conservative management.  POSTOPERATIVE DIAGNOSIS:      Extensive condylomata, which failed conservative management.  PROCEDURE:                    CO2 laser ablation.  SURGEON:                      Douglass Rivers, M.D.  ANESTHESIA:                   ______.  ESTIMATED BLOOD LOSS:         Minimal.  FINDINGS:                     Extensive condylomata.  PATHOLOGY:                    None.  DESCRIPTION OF PROCEDURE:     The patient was taken to the operating room and placed in the dorsal lithotomy position, after _____ anesthesia was induced. She was then prepped with moist towels and a moist Ray-Tec sponge was placed in the rectum.  The CO2 laser was then set on 15 watts, 2 mm focus with varying between pulse of 1-20 and continuous.  The condylomata were gently brushed off to a varying depth of 2-3 mm, which were tracked at multiple intervals and multiple sites throughout the procedure.  When there were areas of bleeding the CO2 laser was defocused and hemostasis was assured.  The condylomata approached the urethra; however, it did not involve the urethra and did not require therapy around the urethra.  The condylomata around the rectum were successfully treated.  DISPOSITION:  The patient tolerated the procedure well.  Sponge, lap and needle counts were correct x2.  A Foley catheter was placed intraoperatively, which she will go home with.  She was discharged home with instructions for sitz baths three times a day and Silvadene to the area.  She was also given analgesics and will follow up in the office in one week. DD:  03/16/00 TD:  03/18/00 Job:  [id number]

## 2010-06-25 NOTE — Assessment & Plan Note (Signed)
[facility]                            CARDIOLOGY OFFICE NOTE   LORINA, DUFFNER                      MRN:          [id number]  DATE:12/27/2005                            DOB:          1976/01/31    PRIMARY CARDIOLOGIST:  Dr. Charlton Haws.   PATIENT PROFILE:  A 35 year old white female with prior history of lupus  and pericarditis who presents secondary to intermittent exertional and  rest chest discomfort.   PROBLEM LIST:  1. History of chest pain and pericarditis/pericardial effusion.      a.     December 26, 2005 2D echocardiogram EF 65%, no evidence of       pericardial effusion, mild MR, mild PR.  2. Lupus anticoagulant.  3. Systemic lupus.  4. Obesity.  5. History of palpitations.   HISTORY OF PRESENT ILLNESS:  A 35 year old married white female with the  above problem list.  She was last seen in clinic on Jun 09, 2005,  underwent an echocardiogram yesterday which did not show any evidence of  pericardial effusion or any suggestion of pericarditis.  She notes that  over the past 6 or 7 months she has experienced on at least 4 or 5  occasions exertional and intensive substernal chest heaviness that she  describe as an elephant on her chest, associated with shortness of  breath lasting approximately 5 minutes and resolving with rest.  She  also has had some episodes of chest discomfort that occur at rest over  that span of time, most likely would occur with deep breathing or by  lying flat on her back.  She denies any PND or orthopnea, dizziness,  syncope, edema or early satiety.   HOME MEDICATIONS:  1. Plaquenil 200 mg daily.  2. Warfarin as directed.  3. Prednisone 8 mg daily.  4. Vitamin D daily.   PHYSICAL EXAMINATION:  Blood pressure 132/78, heart rate 86,  respirations 18.  Her weight is 261 pounds.  Pleasant white female in no  acute distress, alert and oriented x3.  NECK:  No bruits or JVD.  LUNGS:  Respirations regular  and unlabored, CTA.  CARDIAC:  Regular S1, S2, no S3 or S4, murmurs or rubs.  ABDOMEN:  Obese, soft, nontender, nondistended.  Bowel sounds heard in  all 4.  EXTREMITIES:  Warm, dry, pink, no clubbing, cyanosis or edema.   ACCESSORY CLINICAL FINDINGS:  EKG shows sinus rhythm.  Exercise Myoview  is pending.   ASSESSMENT AND PLAN:  1. Chest pain and shortness of breath.  The patient has had this      before but primarily in the setting of pericarditis and      pericardial effusion.  She had an echo yesterday that shows no      evidence of either.  Although it seems unlikely that this would be      ischemic in nature, she does have exertional symptoms that resolve      with rest.  She is obese, although only 35 years old.  I will      obtain and exercise Myoview to take  ischemia out of the picture      completely.  2. History of pericarditis, pericardial effusion.  As above.  She      underwent echocardiogram showing normal LV function without any      evidence of diffusion.  3. Lupus anticoagulant with history of DVT.  This is managed by her      primary physicians.  She remains on Coumadin therapy.  4. Obesity.  The patient is encouraged to exercise.  5. Disposition.  Follow up with Dr. Eden Emms in 6 months.      Nicolasa Ducking, ANP  Electronically Signed      Veverly Fells. Excell Seltzer, MD  Electronically Signed   CB/MedQ  DD: 12/27/2005  DT: 12/27/2005  Job #: [id number]

## 2010-06-25 NOTE — Op Note (Signed)
[name], [name]               ACCOUNT NO.:  [id number]   MEDICAL RECORD NO.:  [id number]          PATIENT TYPE:  INP   LOCATION:  9142                          FACILITY:  [facility]   PHYSICIAN:  Ivor Costa. Farrel Gobble, M.D. DATE OF BIRTH:  [birth year]   DATE OF PROCEDURE:  10/06/2004  DATE OF DISCHARGE:                                 OPERATIVE REPORT   PREOPERATIVE DIAGNOSES:  1.  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  2.  History of systemic lupus erythematosus.  3.  Deep venous thrombosis in current pregnancy.   POSTOPERATIVE DIAGNOSES:  1.  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  2.  History of systemic lupus erythematosus.  3.  Deep venous thrombosis in current pregnancy.   OPERATION/PROCEDURE:  Primary cesarean section, low flap, transverse.   SURGEON:  Ivor Costa. Farrel Gobble, M.D.   ASSISTANT:  Gaetano Hawthorne. Lily Peer, M.D.   ANESTHESIA:  Spinal.   IV FLUIDS:  3500 mL lactated Ringer's.   ESTIMATED BLOOD LOSS:  400 mL.   URINARY OUTPUT:  100 mL clear urine.   FINDINGS:  Delivery of a viable female, vertex with a funic presentation.  Agpars 9 and 9.  Birth weight 7 pounds 0 ounces.  Normal uterus, tubes and  ovaries.   COMPLICATIONS:  None.   PATHOLOGY:  Placenta.   DESCRIPTION OF PROCEDURE:  The patient was taken to the operating room.  Spinal anesthesia was induced, placed in the supine position with left  lateral displacement, prepped and draped in the usual sterile fashion.  The  abdominal pannus was taped up prior to prep.   After adequate anesthesia was insured, a Pfannenstiel skin incision was made  with scalpel and carried to the underlying layer of fascia with  electrocautery with careful attention to transecting the vessels because of  the recent Lovenox use. The fascia was then scored in the midline and  extended laterally with electrocautery.  The inferior aspect of the fascial  incision was grasped with the Kochers.   Underlying rectus muscles were  dissected off sharply again with careful attention to underlying transecting  vessels.  In similar fashion, the superior aspect of the mid fascia was  grasped and bluntly and sharply dissected off with similar attention.  The  rectus muscles were naturally separated in the midline.  The peritoneum was  entered bluntly.  The peritoneal incision was entered superiorly and  inferiorly.  Good visualization of the underlying bowel and bladder.  The  bladder blade was reinserted and the vesicouterine peritoneum was  identified, tented up and entered sharply with the Metzenbaum scissors.  The  incision was extended laterally.  The bladder flap was created digitally.  The bladder blade was then reinserted and the lower uterine segment was  incised in the transverse fashion with the scalpel.  An amniotomy was  performed for marked amount of clear amniotic fluid.  The incision was then  extended bluntly.  As the incision was extended, the cord came up and  through the incision.  Baby Elliotts were placed and the infant was  delivered from the vertex  presentation.  Cord was cut and clamped and the  infant handed off to the waiting pediatricians.  Cord bloods were obtained.  The uterus was massaged and placenta was allowed to separate naturally.  The  uterus was then cleared of all clots and debris.  The uterus was  exteriorized to aid in visualization and was closed with the running locked  layer of 0 chromic.  As it was hemostatic and the patient had been on  Lovenox, we elected not to put in a second layer which may have caused  additional bleeding and the patient concurred.  The pelvis was then  irrigated with copious amounts of warm saline.  The incision remained  hemostatic after returning to the abdomen.  The peritoneum was  reapproximated with 0 Vicryl.  The pelvic again was reirrigated.  The muscle  was noted to be hemostatic.  The fascia was then closed with 0  Vicryl in a  running fashion.  The subcutaneous again was irrigated.  Areas of bleeding  were treated appropriately.  Skin was closed with staples.  The patient  tolerated the procedure well.  Sponge, lap and needle counts were correct  x2.  She  received Ancef intraoperatively and transferred to the PACU in  stable condition.      Ivor Costa. Farrel Gobble, M.D.  Electronically Signed     THL/MEDQ  D:  10/06/2004  T:  10/06/2004  Job:  [id number]

## 2010-06-25 NOTE — H&P (Signed)
[name], CAUDELL NO.:  [id number]   MEDICAL RECORD NO.:  [id number]          PATIENT TYPE:  EMS   LOCATION:  ED                           FACILITY:  [facility]   PHYSICIAN:  Nelma Rothman, MD   DATE OF BIRTH:  07-Nov-1975   DATE OF ADMISSION:  05/24/2005  DATE OF DISCHARGE:                                HISTORY & PHYSICAL   PRIMARY CARE PHYSICIAN:  [name], [facility].   PRIMARY HEMATOLOGIST:  Dr. Cyndie Chime.   PRIMARY RHEUMATOLOGIST:  At that time being is Dr. Corliss Skains.   CHIEF COMPLAINT:  Chest pain and dyspnea on exertion.   HISTORY OF PRESENT ILLNESS:  The patient is a 35 year old female with a  history of lupus, who complained of 2 days of central and right-sided chest  pain which is sharp in nature and worse with inspiration.  She has had  similar chest pains in the past which she has attributed to lupus flares,  but they tend to resolve spontaneously within 1-2 days.  This has persisted  and seems to be slightly worse than usual.  She also complains of  significant dyspnea on exertion, such that she can barely walk from room to  room.  However, she is able to converse comfortably at rest.  The pain is  somewhat improved when she is sitting up or leaning forward.  She has a  history of DVT approximately 1 year ago during her second pregnancy, so she  was referred by her primary care physician to the emergency department to  rule out pulmonary embolus as an etiology of her symptoms after presenting  there to clinic earlier today.  CTA of the chest was negative for PE here at  the hospital, but did show pericardial and pleural effusion, so we are asked  to admit for further management.   PAST MEDICAL HISTORY:  1.  Lupus diagnosed at age 68.  2.  Antiphospholipid antibody syndrome.  3.  DVT approximately 1 year ago during her second pregnancy.  4.  Abnormal INR due to strong interfering antiphospholipid antibodies.      Please note that her INR  should not be used to follow her Coumadin      levels and rather, Dr. Cyndie Chime has been using Xa levels to ensure      adequate anticoagulation.  .  Please note that on April12,2007, her Xa      level was 20.20 and the patient was instructed to continue her Coumadin      at 7 mg daily, as this was within therapeutic range.  5.  Recent diagnosis of probable cellulitis of the dorsum of the left foot,      on a 7-day course of doxycycline of which she has completed 3 days.   SOCIAL HISTORY:  She is self-employed and married.  She quit smoking in  2005.   FAMILY HISTORY:  There is no history of lupus.   ALLERGIES:  No known drug allergies.   MEDICATIONS:  1.  Coumadin 7 mg p.o. daily.  2.  Prednisone 10 mg p.o. daily, which she has  been on since her pregnancy.  3.  Doxycycline 100 mg p.o. b.i.d. for a total of 7 days of which she has      completed 3 days.   REVIEW OF SYSTEMS:  She denies any fevers or chills.  She has had recent  swelling of the dorsum of the left foot which was getting better after being  started on doxycycline for presumed cellulitis.  Otherwise, 10-point review  of systems is negative.  EKG earlier today showed sinus tachycardia with a  rate of 105, but no other ischemic changes.  CTA of the chest demonstrated  no pulmonary embolus.  There was a small- to moderate-sized pericardial  effusion as well as a small right pleural effusion and mild bibasilar  atelectasis, right greater than left.   PHYSICAL EXAM:  VITAL SIGNS:  Temperature 98.7, pulse 96-114, blood pressure  141/98, respiration rate 20, oxygen saturation 100% on 2 L nasal cannula.  GENERAL:  She is in no apparent distress and conversant at rest, although  slightly dyspneic after long sentences.  HEENT:  Her mucous membranes are  moist.  NECK:  Her neck is supple.  There is no jugular venous distension.  HEART:  Her heart has a regular rate and rhythm.  She does have a friction  rub.  LUNGS:  Clear  to auscultation bilaterally.  There are no crackles or  wheezes.  ABDOMEN:  Soft, nontender and nondistended with normoactive bowel sounds.  EXTREMITIES:  There is slight swelling and discoloration of the dorsum and  lateral aspect of the left foot, which seems most consistent with an  evolving ecchymosis.   LABORATORY DATA:  White blood cell count 10.8, hemoglobin 12.3, hematocrit  37.0 and platelet count 462,000.  A D-dimer was 2.04.  Sodium 138, potassium  4.4, chloride 105, bicarb 28, BUN 7, creatinine 0.7 and glucose 95.  Urinalysis demonstrates a small amount of blood and 0-2 red blood cells, but  otherwise negative.   ASSESSMENT/PLAN:  1.  Lupus flare with pleurisy and pericarditis.  We will plan to start her      on intravenous Solu-Medrol while here in the hospital and anticipate a      slow prednisone taper once outpatient.  She states that she has an      appointment with a rheumatologist at [facility] in approximately 2      weeks, but I suspect she may benefit from an inpatient rheumatology      consultation while here, depending on her clinical course.  We will      check a 2-D echocardiogram to determine the true size of the effusion,      given that she is at somewhat higher risk on anticoagulation.  We will      also check anti-double-stranded DNA titer as well as complement levels      to evaluate the activity of her lupus.  2.  History of deep venous thrombosis and antiphospholipid syndrome.  We      will continue her Coumadin at 7 mg daily and will not follow INR levels      per Dr. Patsy Lager instructions.  Should she be in the hospital for      longer than a few days, consultation with Dr. Cyndie Chime may be      warranted to ensure adequate anticoagulation.  3.  Left foot cellulitis versus traumatic injury seems to be improving      nevertheless.  We will plan to complete her course  of doxycycline, which     she has already started.      Nelma Rothman, MD  Electronically Signed     RAR/MEDQ  D:  05/24/2005  T:  05/24/2005  Job:  [id number]

## 2010-06-25 NOTE — H&P (Signed)
[name], [name]               ACCOUNT NO.:  [id number]   MEDICAL RECORD NO.:  [id number]          PATIENT TYPE:  INP   LOCATION:  NA                            FACILITY:  [facility]   PHYSICIAN:  Ivor Costa. Farrel Gobble, M.D. 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   SECONDARY DIAGNOSIS:  1.  History of lupus on prednisone.  2.  Deep venous thrombosis this pregnancy.   HISTORY OF PRESENT ILLNESS:  The patient is a 35 year old, gravida 1 with an  LMP of January 14, 2005, estimated date of confinement of October 19, 2004, estimated gestational age 13-1/2 weeks who was noted on her OB  appointment to have a narrow inlet arch.  The patient was noted also later  in the pregnancy to have BPD in the 73rd percentile which has remained  consistent on followup scans.  Because of the disproportionate nature of the  biparietal diameter to the pelvic inlet, the patient elected for primary  cesarean section.  Her pregnancy has also been complicated by diagnosis of  lupus which preceded the pregnancy. The patient was noted to have a very  high ANA, very high lupus anticoagulant, elevated Beta-2 glycoprotein and  because of her increased risk for coagulopathy, the patient was placed on  Lovenox 40 mg daily for prophylaxis.  The patient, despite this, however,  developed a DVT in this pregnancy, requiring hospitalization and is  currently on Lovenox 120 twice a day.  In addition, because of her lupus,  the patient is on prednisone and will require stress dose steroids in the  postpartum period.  Because of her risk, the patient has been followed with  NSTs and AFIs, all of which have been normal.  Her fluid most recently is 17  and has been in the 65th to 75th percentile throughout.  Her NSTs have been  reactive.  The patient presents today without any  complaints and reports  good fetal movement.  She is A positive, antibody negative, RPR nonreactive,  rubella immune, hepatitis B surface antigen nonreactive, HIV nonreactive,  GBS negative.  Refer to the [place].   PHYSICAL EXAMINATION:  GENERAL:  Well-appearing gravida in no acute  distress.  Her weight is 255 which is a 36-pound weight gain.  HEART:  Regular rate.  LUNGS:  Clear to auscultation.  ABDOMEN:  Fundal height was 38.  Her abdomen is most remarkable for striae.  VAGINAL:  Deferred.  NST was reactive without decelerations and  contractions.  AFI was 17 and vertex.  EXTREMITIES:  Trace edema.   ASSESSMENT:  Now pelvic inlet with suspected cephalopelvic disproportion on  Lovenox prophylaxis for an elective primary cesarean section.  The patient  will present on the morning of August 30 for the above procedure.  She will stop her Lovenox on August 28, making it 36 hours without  medication prior to her surgery.  The patient will also take Solu-Medrol 125  twice a day for three  days postpartum for stress dose because of her long-  standing prednisone use.  The risks of prematurity again were addressed and  accepted.      Ivor Costa. Farrel Gobble, M.D.  Electronically Signed     THL/MEDQ  D:  09/27/2004  T:  09/27/2004  Job:  [id number]

## 2010-06-25 NOTE — Discharge Summary (Signed)
NAMESHAR, PAEZ               ACCOUNT NO.:  [id number]   MEDICAL RECORD NO.:  [id number]          PATIENT TYPE:  INP   LOCATION:  9142                          [facility]ILITY[facility]   PHYSICIAN:  Ivor Costa. Farrel Gobble, M.D. DATE OF BIRTH:  06-19-1975   DATE OF ADMISSION:  10/06/2004  DATE OF DISCHARGE:  10/09/2004                                 DISCHARGE SUMMARY   ADMISSION DIAGNOSIS:  Term pregnancy.   DISCHARGE DIAGNOSES:  1.  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  2.  History of systemic lupus erythematous on prednisone.  3.  Deep venous thrombosis in current pregnancy.   PROCEDURES:  1.  Primary cesarean section.  2.  Stress dose steroids.  3.  Anticoagulation therapy.   HOSPITAL COURSE:  Please refer to the dictated H&P.  The patient presented  on the morning of October 06, 2004, and underwent a primary cesarean section,  low flap transverse under spinal anesthesia for suspected cephalopelvic  disproportion with a narrow pelvic arch with delivery of a viable female,  Apgar's 9 and 9.  Birth weight was 7 pounds.  The patient was transferred to  the recovery room and then to the postpartum floor in due fashion.  During  her postoperative course, the patient had a pressure dressing placed in the  recovery room which was removed on day #2.  She was placed on stress dose  steroids with Solu-Medrol 125 mg b.i.d. for a total of 6 doses.  She also  restarted her Lovenox 120 b.i.d. on postop day #1.  At the time of  discharge, the patient was ambulating without difficulty.  Diet was  tolerated.  Pain was managed and she was without any complaints.  She was  bottle-feeding.  Her postop exam showed she was afebrile, her vital signs  were stable.  Her heart was regular rate.  Her lungs were clear to  auscultation.  Her abdomen was obese and soft.  Her uterus was firm, below  the umbilicus and nontender.  The incision was intact with staples and Steri-  Strips.  Extremities were negative.  The  patient was also kept on PAS while  in bed which were off at the time of discharge.   DISCHARGE LABORATORY DATA AND X-RAY FINDINGS:  Hemoglobin 10.1, hematocrit  29.9, white count 15.9 and her platelets were 349.   DISCHARGE MEDICATIONS:  1.  Percocet 5/325 at her preop appointment.  2.  Coumadin 5 mg daily with instructions to return back for a PT/PTT and      INR after her fourth dose.  3.  She make take over-the-counter Motrin.   SPECIAL INSTRUCTIONS:  Her staples were removed at the time of discharge and  all questions were addressed.      Ivor Costa. Farrel Gobble, M.D.  Electronically Signed     THL/MEDQ  D:  10/09/2004  T:  10/09/2004  Job:  [id number]

## 2010-06-25 NOTE — Discharge Summary (Signed)
[name], COMMON               ACCOUNT NO.:  [id number]   MEDICAL RECORD NO.:  [id number]          PATIENT TYPE:  INP   LOCATION:  1412                         FACILITY:  [facility]   PHYSICIAN:  Isidor Holts, M.D.  DATE OF BIRTH:  [birth year]   DATE OF ADMISSION:  05/23/2005  DATE OF DISCHARGE:  05/26/2005                                 DISCHARGE SUMMARY   DISCHARGE DIAGNOSIS:  1.  Systemic lupus erythematosus.  2.  Acute peritonitis/Pleuritis, secondary to # 1.  3.  History of anti-phospholipid antibody syndrome.  4.  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  5.  Left foot cellulitis.   DISCHARGE MEDICATIONS:  1.  Coumadin 7 mg p.o. daily.  2.  Prednisone 40 mg p.o. daily, until reviewed by Dr. Corliss Skains,      rheumatologist.  3.  Prilosec OTC 20 mg p.o. daily.   PROCEDURES:  1.  Chest x-ray May 23, 2005, showed a small right pleural effusion with      bibasilar air space disease, right greater than left.  2.  Chest CT angiogram May 23, 2005, showed no pulmonary embolism, there      was a small to moderate size pericardial effusion, small right pleural      effusion, mild bibasilar atelectasis, greater on the right.  3.  2D echocardiogram May 24, 2005, showed overall vigorous left      ventricular systolic function, LVEF 60% to 70%, no diagnostic regional      left ventricular wall motion abnormality, left atrium was mildly      dilated, right ventricle size was at the upper limits of normal, there      was a small pericardial effusion, there were no manifestations of      tamponade.   CONSULTATIONS:  Genene Churn. Cyndie Chime, M.D.   ADMISSION HISTORY:  As in H&P dated May 24, 2005, dictated by Dr. Threasa Beards. However, in brief, this is a 35 year old female, with known history  of systemic lupus erythematosus, anti-phospholipid antibody syndrome, DVT  left leg approximately one year ago.  She had been placed on a course of  Doxycycline for  cellulitis of the dorsum of the left foot three days prior  to presentation.  She presents on this occasion, with right sided pleuritic  type chest pain/central chest pain, as well as shortness of breath on  exertion, all of two days duration.  She was admitted for further evaluation  and management.   CLINICAL COURSE:  Problem 1:  Acute peritonitis/Pleuritis.  The patient has a known history of  systemic lupus erythematosus, diagnosed at age 53 years and has been under  the care of Dr. Corliss Skains, rheumatologist.  She presents on this occasion,  with pleuritic type right sided and central chest pain.  Physical  examination revealed a probable pericardial rub.  Chest x-ray demonstrated  small right effusion with bibasilar air space disease.  Chest CT angiogram  done to rule out possible pulmonary embolism, revealed mild to moderate  pericardial effusion, as well as a small right pleural effusion.  It was  felt that these features were consistent with acute peritonitis/pleuritis,  due to flare up of systemic lupus erythematosus.  Serum complement C3 nd C4  levels were within normal limits.  Anti-Ds DNA was elevated at 22.  As the  patient is on anticoagulation therapy for thrombophilia, secondary to anti-  phospholipid antibody syndrome with previous left leg DVT approximately one  year ago, we were unable to utilize NSAIDS in the management of this  condition.  She was, therefore, managed with intravenous  Solu-Medrol  burst treatment.  I had a discussion with the patient's primary  rheumatologist, Dr. Corliss Skains, who agreed with the course of treatment and  recommended that the patient have a three day course of Solu-Medrol after  which she can be transitioned to oral prednisone with a slow taper in mind.  The patient responded dramatically to this treatment, and by the third day  of hospital admission was no longer short of breath at rest or exertion, was  able to ambulate and no longer  had any appreciable chest pain.  2D  echocardiogram dated May 24, 2005, confirmed a mild pericardial effusion,  vigorous LV function, no evidence of tamponade.   Problem 2:  Cellulitis of the left foot.  The patient was diagnosed with  cellulitis of the dorsum of her left foot, about three days prior to  presentation and had been commenced on Doxycycline for this.  She appears to  have responded well to this treatment, and by May 25, 2005, all  inflammation had subsided.  Doxycycline is to be discontinued after  completion of a seven day course of this treatment.   Problem 3:  Thrombophilia, secondary to anti-phospholipid antibody syndrome.  The patient's primary hematologist/oncologist is Dr. Cephas Darby, who  was called in consultation.  He has recommended continuing the patient on  pre-admission dosage of Coumadin at 7 mg p.o. daily, which we did, and he  will follow up the patient's factor Xa levels at his discretion, in his  office.   DISPOSITION:  The patient has sufficiently recovered to be discharged on  May 26, 2005, after completion of third day of IV Solu-Medrol.  She is now  completely asymptomatic and is ready to go home.   DISCHARGE INSTRUCTIONS:  Diet no restrictions.  Activities as tolerated.  Wound care not applicable.  Pain management not applicable.   FOLLOW UP:  The patient has been instructed to follow up with her primary  rheumatologist, Dr. Corliss Skains on May 31, 2005, at 8:20 a.m.  This  appointment has already been scheduled, telephone number [phone].  She is  also to follow up with Dr. Cephas Darby, her primary hematologist, per  prior scheduled appointment.  All this has been communicated to the patient,  she has verbalized understanding.      Isidor Holts, M.D.  Electronically Signed     CO/MEDQ  D:  05/25/2005  T:  05/25/2005  Job:  [id number]   cc:   Genene Churn. Cyndie Chime, M.D.  Fax: [phone]   Kathryne Hitch, MD Fax:  [phone]

## 2010-06-25 NOTE — H&P (Signed)
NAMEAVARI, NEVARES               ACCOUNT NO.:  [id number]   MEDICAL RECORD NO.:  [id number]          PATIENT TYPE:  OUT   LOCATION:  VASC                         FACILITY:  [facility]   PHYSICIAN:  Timothy P. Fontaine, M.D.DATE OF BIRTH:  04-10-1975   DATE OF ADMISSION:  08/23/2004  DATE OF DISCHARGE:                                HISTORY & PHYSICAL   CHIEF COMPLAINT:  1.  Pain in left leg.  2.  Pregnancy at [redacted] weeks gestation.  3.  History of lupus.   HISTORY OF PRESENT ILLNESS:  A 35 year old, G1, P0, female, [redacted] weeks  gestation, being followed for lupus erythematosus, history of positive lupus  anticoagulant, positive anti-cardiolipin antibody, positive beta II,  glycoprotein I, on Lovenox 40 mg daily for deep vein thrombosis prophylaxis.  She is also on prednisone 10 mg q.a.m. for her lupus.  The patient noted  over the last several days increasing left calf pain, both on rest, as well  as with ambulation.  She had presented for a routine OB appointment with  this complaint.  Physical exam revealed tenderness along the calf with a  positive dorsiflexion sign.  She was studied with vascular Doppler studies  with a verbal report showing a deep vein thrombosis involving the tibial  popliteal system.  She is admitted at this time for anticoagulation.  For  the remainder of her history, see her hollister.   PHYSICAL EXAMINATION:  VITAL SIGNS:  Afebrile.  Vital signs are stable.  HEENT:  Normal.  LUNGS:  Clear.  CARDIAC:  Regular rate.  No rubs, murmurs, or gallops.  ABDOMEN:  A gravid vertex fetus, consistent with 32 weeks.  External monitor  shows a reactive fetal tracing.  No contractions.  PELVIC:  Deferred.   ASSESSMENT AND PLAN:  A 35 year old, G1, P0, female, at [redacted] weeks gestation,  with left lower leg DVT.  The patient had been on Lovenox prophylaxis.  I  reviewed the case with Dr. Cephas Darby, and his recommendation was to  begin Lovenox 1 mg/kg q.12h.  Her weight is  252, which approximates 120 mg  q.12h.  He would recommend doing a heparin level 4 hours post dose after a  day or so of therapy.  Will plan on continuing her on her prednisone 10 mg  q.a.m.  Will obtain a full obstetrical baseline ultrasound for fetal weight,  and check baseline studies to include CBC, comprehensive metabolic panel,  and PT and PTT.  Will plan on daily antepartum testing with non-stress test,  and tentatively plan on hospitalization for several days, and discharge  after her heparin level is appropriate.       TPF/MEDQ  D:  08/23/2004  T:  08/23/2004  Job:  [id number]

## 2010-07-19 ENCOUNTER — Encounter (HOSPITAL_BASED_OUTPATIENT_CLINIC_OR_DEPARTMENT_OTHER): Payer: 59 | Admitting: Oncology

## 2010-07-19 ENCOUNTER — Other Ambulatory Visit: Payer: Self-pay | Admitting: Oncology

## 2010-07-19 DIAGNOSIS — Z7901 Long term (current) use of anticoagulants: Secondary | ICD-10-CM

## 2010-07-19 DIAGNOSIS — M329 Systemic lupus erythematosus, unspecified: Secondary | ICD-10-CM

## 2010-07-19 DIAGNOSIS — Z5181 Encounter for therapeutic drug level monitoring: Secondary | ICD-10-CM

## 2010-07-19 DIAGNOSIS — Z86718 Personal history of other venous thrombosis and embolism: Secondary | ICD-10-CM

## 2010-07-22 ENCOUNTER — Ambulatory Visit (INDEPENDENT_AMBULATORY_CARE_PROVIDER_SITE_OTHER): Payer: 59 | Admitting: Women's Health

## 2010-07-22 DIAGNOSIS — B373 Candidiasis of vulva and vagina: Secondary | ICD-10-CM

## 2010-08-05 ENCOUNTER — Encounter (HOSPITAL_BASED_OUTPATIENT_CLINIC_OR_DEPARTMENT_OTHER): Payer: 59 | Admitting: Oncology

## 2010-08-05 ENCOUNTER — Other Ambulatory Visit: Payer: Self-pay | Admitting: Oncology

## 2010-08-05 DIAGNOSIS — Z7901 Long term (current) use of anticoagulants: Secondary | ICD-10-CM

## 2010-08-05 DIAGNOSIS — I824Z9 Acute embolism and thrombosis of unspecified deep veins of unspecified distal lower extremity: Secondary | ICD-10-CM

## 2010-08-05 DIAGNOSIS — Z86718 Personal history of other venous thrombosis and embolism: Secondary | ICD-10-CM

## 2010-08-05 DIAGNOSIS — M329 Systemic lupus erythematosus, unspecified: Secondary | ICD-10-CM

## 2010-08-05 LAB — CBC WITH DIFFERENTIAL/PLATELET
Basophils Absolute: 0 10*3/uL (ref 0.0–0.1)
EOS%: 0.9 % (ref 0.0–7.0)
MCH: 29.8 pg (ref 25.1–34.0)
MCHC: 34.4 g/dL (ref 31.5–36.0)
MONO#: 0.5 10*3/uL (ref 0.1–0.9)
NEUT%: 77.1 % — ABNORMAL HIGH (ref 38.4–76.8)
Platelets: 314 10*3/uL (ref 145–400)
RBC: 4.15 10*6/uL (ref 3.70–5.45)
RDW: 13 % (ref 11.2–14.5)

## 2010-08-05 LAB — PROTIME-INR: INR: 2.2 (ref 2.00–3.50)

## 2010-09-29 ENCOUNTER — Other Ambulatory Visit: Payer: Self-pay | Admitting: Oncology

## 2010-09-29 ENCOUNTER — Encounter (HOSPITAL_BASED_OUTPATIENT_CLINIC_OR_DEPARTMENT_OTHER): Payer: 59 | Admitting: Oncology

## 2010-09-29 DIAGNOSIS — Z7901 Long term (current) use of anticoagulants: Secondary | ICD-10-CM

## 2010-09-29 DIAGNOSIS — D6859 Other primary thrombophilia: Secondary | ICD-10-CM

## 2010-09-29 DIAGNOSIS — Z5181 Encounter for therapeutic drug level monitoring: Secondary | ICD-10-CM

## 2010-11-01 ENCOUNTER — Encounter (HOSPITAL_BASED_OUTPATIENT_CLINIC_OR_DEPARTMENT_OTHER): Payer: 59 | Admitting: Oncology

## 2010-11-01 ENCOUNTER — Other Ambulatory Visit: Payer: Self-pay | Admitting: Oncology

## 2010-11-01 DIAGNOSIS — M329 Systemic lupus erythematosus, unspecified: Secondary | ICD-10-CM

## 2010-11-01 DIAGNOSIS — Z86718 Personal history of other venous thrombosis and embolism: Secondary | ICD-10-CM

## 2010-11-01 DIAGNOSIS — Z7901 Long term (current) use of anticoagulants: Secondary | ICD-10-CM

## 2010-11-01 LAB — PROTIME-INR
INR: 4.5 — ABNORMAL HIGH (ref 2.00–3.50)
Protime: 54 Seconds — ABNORMAL HIGH (ref 10.6–13.4)

## 2010-11-23 ENCOUNTER — Other Ambulatory Visit: Payer: Self-pay | Admitting: Oncology

## 2010-11-23 ENCOUNTER — Ambulatory Visit (HOSPITAL_BASED_OUTPATIENT_CLINIC_OR_DEPARTMENT_OTHER): Payer: 59 | Admitting: Oncology

## 2010-11-23 DIAGNOSIS — M329 Systemic lupus erythematosus, unspecified: Secondary | ICD-10-CM

## 2010-11-23 DIAGNOSIS — Z86718 Personal history of other venous thrombosis and embolism: Secondary | ICD-10-CM

## 2010-11-23 DIAGNOSIS — Z7901 Long term (current) use of anticoagulants: Secondary | ICD-10-CM

## 2010-11-23 LAB — PROTIME-INR
INR: 1.1 — ABNORMAL LOW (ref 2.00–3.50)
Protime: 13.2 Seconds (ref 10.6–13.4)

## 2010-11-23 LAB — CHROMOGENIC FACTOR X: CHROM XA: 90.4 % (ref 86–146)

## 2010-11-30 ENCOUNTER — Encounter (HOSPITAL_BASED_OUTPATIENT_CLINIC_OR_DEPARTMENT_OTHER): Payer: 59 | Admitting: Oncology

## 2010-11-30 ENCOUNTER — Other Ambulatory Visit: Payer: Self-pay | Admitting: Oncology

## 2010-11-30 DIAGNOSIS — M329 Systemic lupus erythematosus, unspecified: Secondary | ICD-10-CM

## 2010-11-30 DIAGNOSIS — Z86718 Personal history of other venous thrombosis and embolism: Secondary | ICD-10-CM

## 2010-11-30 DIAGNOSIS — Z7901 Long term (current) use of anticoagulants: Secondary | ICD-10-CM

## 2010-11-30 LAB — PROTIME-INR: Protime: 22.8 Seconds — ABNORMAL HIGH (ref 10.6–13.4)

## 2010-12-07 ENCOUNTER — Encounter (HOSPITAL_BASED_OUTPATIENT_CLINIC_OR_DEPARTMENT_OTHER): Payer: 59 | Admitting: Oncology

## 2010-12-07 ENCOUNTER — Other Ambulatory Visit: Payer: Self-pay | Admitting: Oncology

## 2010-12-07 DIAGNOSIS — M329 Systemic lupus erythematosus, unspecified: Secondary | ICD-10-CM

## 2010-12-07 DIAGNOSIS — Z7901 Long term (current) use of anticoagulants: Secondary | ICD-10-CM

## 2010-12-07 DIAGNOSIS — Z86718 Personal history of other venous thrombosis and embolism: Secondary | ICD-10-CM

## 2010-12-07 LAB — PROTIME-INR
INR: 2.3 (ref 2.00–3.50)
Protime: 27.6 Seconds — ABNORMAL HIGH (ref 10.6–13.4)

## 2010-12-14 DIAGNOSIS — Z7901 Long term (current) use of anticoagulants: Secondary | ICD-10-CM | POA: Insufficient documentation

## 2010-12-14 DIAGNOSIS — R760 Raised antibody titer: Secondary | ICD-10-CM | POA: Insufficient documentation

## 2010-12-23 ENCOUNTER — Ambulatory Visit: Payer: 59

## 2010-12-23 ENCOUNTER — Other Ambulatory Visit: Payer: Self-pay | Admitting: Oncology

## 2010-12-23 ENCOUNTER — Other Ambulatory Visit (HOSPITAL_BASED_OUTPATIENT_CLINIC_OR_DEPARTMENT_OTHER): Payer: 59 | Admitting: Lab

## 2010-12-23 DIAGNOSIS — R760 Raised antibody titer: Secondary | ICD-10-CM

## 2010-12-23 DIAGNOSIS — Z86718 Personal history of other venous thrombosis and embolism: Secondary | ICD-10-CM

## 2010-12-23 DIAGNOSIS — Z7901 Long term (current) use of anticoagulants: Secondary | ICD-10-CM

## 2010-12-23 LAB — PROTIME-INR: Protime: 26.4 Seconds — ABNORMAL HIGH (ref 10.6–13.4)

## 2010-12-23 NOTE — Progress Notes (Signed)
Pt has been taking alternate day dosing of 4 and 5 mg.  No complaints today.  Previous Xa levels appear to correlate well with INR values.  Will recheck INR only at [facility] next month.

## 2010-12-29 ENCOUNTER — Other Ambulatory Visit: Payer: Self-pay | Admitting: Pharmacist

## 2010-12-29 DIAGNOSIS — Z8672 Personal history of thrombophlebitis: Secondary | ICD-10-CM

## 2010-12-29 DIAGNOSIS — M329 Systemic lupus erythematosus, unspecified: Secondary | ICD-10-CM

## 2011-01-24 ENCOUNTER — Ambulatory Visit (HOSPITAL_BASED_OUTPATIENT_CLINIC_OR_DEPARTMENT_OTHER): Payer: 59 | Admitting: Oncology

## 2011-01-24 DIAGNOSIS — R760 Raised antibody titer: Secondary | ICD-10-CM

## 2011-01-24 DIAGNOSIS — Z7901 Long term (current) use of anticoagulants: Secondary | ICD-10-CM

## 2011-01-24 DIAGNOSIS — R894 Abnormal immunological findings in specimens from other organs, systems and tissues: Secondary | ICD-10-CM

## 2011-01-24 NOTE — Patient Instructions (Signed)
Increase Coumadin to 5 mg daily

## 2011-02-02 ENCOUNTER — Other Ambulatory Visit: Payer: Self-pay | Admitting: Pharmacist

## 2011-02-02 DIAGNOSIS — I824Z9 Acute embolism and thrombosis of unspecified deep veins of unspecified distal lower extremity: Secondary | ICD-10-CM

## 2011-02-03 ENCOUNTER — Ambulatory Visit (HOSPITAL_BASED_OUTPATIENT_CLINIC_OR_DEPARTMENT_OTHER): Payer: Self-pay | Admitting: Oncology

## 2011-02-03 ENCOUNTER — Ambulatory Visit: Payer: 59

## 2011-02-03 ENCOUNTER — Other Ambulatory Visit (HOSPITAL_BASED_OUTPATIENT_CLINIC_OR_DEPARTMENT_OTHER): Payer: 59 | Admitting: Lab

## 2011-02-03 ENCOUNTER — Other Ambulatory Visit: Payer: Self-pay | Admitting: Pharmacist

## 2011-02-03 DIAGNOSIS — Z8672 Personal history of thrombophlebitis: Secondary | ICD-10-CM

## 2011-02-03 DIAGNOSIS — Z7901 Long term (current) use of anticoagulants: Secondary | ICD-10-CM

## 2011-02-03 DIAGNOSIS — I82220 Acute embolism and thrombosis of inferior vena cava: Secondary | ICD-10-CM

## 2011-02-03 DIAGNOSIS — M329 Systemic lupus erythematosus, unspecified: Secondary | ICD-10-CM

## 2011-02-03 DIAGNOSIS — R894 Abnormal immunological findings in specimens from other organs, systems and tissues: Secondary | ICD-10-CM

## 2011-02-03 DIAGNOSIS — R760 Raised antibody titer: Secondary | ICD-10-CM

## 2011-02-03 DIAGNOSIS — I824Z9 Acute embolism and thrombosis of unspecified deep veins of unspecified distal lower extremity: Secondary | ICD-10-CM

## 2011-02-03 LAB — POCT INR
INR: 1.5
INR: 1.5

## 2011-02-03 MED ORDER — WARFARIN SODIUM 5 MG PO TABS: ORAL_TABLET | ORAL | Status: DC

## 2011-02-03 NOTE — Progress Notes (Signed)
No apparent cause for decrease in INR.  She has not had as many salads over Christmas.  She has not taken her Plaquenil for 2 days (no interaction). I will increase dose to 6 mg/day. Pt may start on probiotic (no interaction) for chronic yeast infection. Repeat INR w/ Chrom. Xa level next week. Pt knows to call if bleeding occurs of if starts on Diflucan. [name], Pharm.D. Addendum: Chrom Xa level on 02/03/11 = 36.9% (goal = 25-35%). This correlates w/ decreased INR.

## 2011-02-04 LAB — CHROMOGENIC FACTOR X: CHROM XA: 36.9 % — ABNORMAL LOW (ref 86–146)

## 2011-02-10 ENCOUNTER — Ambulatory Visit: Payer: Self-pay | Admitting: Pharmacist

## 2011-02-10 ENCOUNTER — Ambulatory Visit: Payer: 59

## 2011-02-10 ENCOUNTER — Other Ambulatory Visit (HOSPITAL_BASED_OUTPATIENT_CLINIC_OR_DEPARTMENT_OTHER): Payer: 59 | Admitting: Lab

## 2011-02-10 DIAGNOSIS — M329 Systemic lupus erythematosus, unspecified: Secondary | ICD-10-CM

## 2011-02-10 DIAGNOSIS — Z8672 Personal history of thrombophlebitis: Secondary | ICD-10-CM

## 2011-02-10 DIAGNOSIS — Z7901 Long term (current) use of anticoagulants: Secondary | ICD-10-CM

## 2011-02-10 DIAGNOSIS — R760 Raised antibody titer: Secondary | ICD-10-CM

## 2011-02-10 LAB — PROTIME-INR: INR: 4.5 — ABNORMAL HIGH (ref 2.00–3.50)

## 2011-02-10 NOTE — Progress Notes (Signed)
Hold dose today and resume at 4 mg daily until INR recheck on Tue 02/15/11 at 8:30 am.  Pt started a 7 day treatment of fluconazole on 02/09/11.

## 2011-02-10 NOTE — Patient Instructions (Signed)
Hold dose today and resume at 4mg  daily and recheck INR on Tue.  Pt also started a 7 day treatment of fluconazole on 02/09/11.

## 2011-02-11 LAB — CHROMOGENIC FACTOR X: CHROM XA: 23.1 % — ABNORMAL LOW (ref 86–146)

## 2011-02-11 NOTE — Progress Notes (Signed)
Chrom Xa level = 23.1% (goal = 25-35%). Correlates w/ supratherapeutic INR. Cont w/ plan of protime 02/15/11 [name], Pharm.D.

## 2011-02-12 ENCOUNTER — Telehealth: Payer: Self-pay | Admitting: Oncology

## 2011-02-12 NOTE — Telephone Encounter (Signed)
Called pt , left message for appt in [place], r/s appt from 03/15/11 due to MD's PAL

## 2011-02-14 NOTE — Progress Notes (Signed)
Agree. thanks

## 2011-02-15 ENCOUNTER — Other Ambulatory Visit (HOSPITAL_BASED_OUTPATIENT_CLINIC_OR_DEPARTMENT_OTHER): Payer: 59 | Admitting: Lab

## 2011-02-15 ENCOUNTER — Ambulatory Visit: Payer: Self-pay | Admitting: Oncology

## 2011-02-15 ENCOUNTER — Ambulatory Visit: Payer: 59

## 2011-02-15 DIAGNOSIS — M329 Systemic lupus erythematosus, unspecified: Secondary | ICD-10-CM

## 2011-02-15 DIAGNOSIS — Z7901 Long term (current) use of anticoagulants: Secondary | ICD-10-CM

## 2011-02-15 DIAGNOSIS — R760 Raised antibody titer: Secondary | ICD-10-CM

## 2011-02-15 DIAGNOSIS — Z8672 Personal history of thrombophlebitis: Secondary | ICD-10-CM

## 2011-02-15 DIAGNOSIS — Z5181 Encounter for therapeutic drug level monitoring: Secondary | ICD-10-CM

## 2011-02-15 DIAGNOSIS — Z86718 Personal history of other venous thrombosis and embolism: Secondary | ICD-10-CM

## 2011-02-15 NOTE — Progress Notes (Unsigned)
INR today supratherapeutic (4.6).  Likely r/t use of fluconazole.  She will complete the fluconazole therapy in 2 more days.  Will hold Coumadin x 2 days, then resume at 4mg  daily.  Recheck INR in 8 days.

## 2011-02-23 ENCOUNTER — Other Ambulatory Visit: Payer: Self-pay | Admitting: Pharmacist

## 2011-02-23 ENCOUNTER — Ambulatory Visit (HOSPITAL_BASED_OUTPATIENT_CLINIC_OR_DEPARTMENT_OTHER): Payer: Self-pay | Admitting: Oncology

## 2011-02-23 ENCOUNTER — Ambulatory Visit: Payer: 59

## 2011-02-23 ENCOUNTER — Other Ambulatory Visit (HOSPITAL_BASED_OUTPATIENT_CLINIC_OR_DEPARTMENT_OTHER): Payer: 59 | Admitting: Lab

## 2011-02-23 DIAGNOSIS — Z7901 Long term (current) use of anticoagulants: Secondary | ICD-10-CM

## 2011-02-23 DIAGNOSIS — R894 Abnormal immunological findings in specimens from other organs, systems and tissues: Secondary | ICD-10-CM

## 2011-02-23 DIAGNOSIS — Z8672 Personal history of thrombophlebitis: Secondary | ICD-10-CM

## 2011-02-23 DIAGNOSIS — R760 Raised antibody titer: Secondary | ICD-10-CM

## 2011-02-23 DIAGNOSIS — M329 Systemic lupus erythematosus, unspecified: Secondary | ICD-10-CM

## 2011-02-23 LAB — PROTIME-INR
INR: 2 (ref 2.00–3.50)
Protime: 24 Seconds — ABNORMAL HIGH (ref 10.6–13.4)

## 2011-02-23 NOTE — Progress Notes (Signed)
Pt w/o complaints re: anticoag. Starting on a 3 day "cleanser" diet w/ her husband to try to lose weight.   Given the fact her INR was recently supratherapeutic due to dose increase of Coumadin & Fluconazole tx, 2 days of her Coumadin doses were held last week then reduced to 4 mg/day. In a weeks time (now off Fluconazole), her INR dropped from 4.6 to 2.0. I think it is necessary to add in a few days of 5 mg as she was taking before the holiday & remaining somewhat within range. Change to 5 mg Wed & Fri then 4 mg/day other days. We can repeat INR next week. [name] goes to [place] on 03/09/11 so we can have them do the INR there then. [name], Pharm.D.

## 2011-02-25 ENCOUNTER — Other Ambulatory Visit: Payer: Self-pay | Admitting: *Deleted

## 2011-02-25 DIAGNOSIS — R760 Raised antibody titer: Secondary | ICD-10-CM

## 2011-02-25 DIAGNOSIS — Z7901 Long term (current) use of anticoagulants: Secondary | ICD-10-CM

## 2011-02-25 MED ORDER — WARFARIN SODIUM 5 MG PO TABS: ORAL_TABLET | ORAL | Status: DC

## 2011-03-02 ENCOUNTER — Ambulatory Visit: Payer: 59

## 2011-03-02 ENCOUNTER — Other Ambulatory Visit (HOSPITAL_BASED_OUTPATIENT_CLINIC_OR_DEPARTMENT_OTHER): Payer: 59 | Admitting: Lab

## 2011-03-02 DIAGNOSIS — I82220 Acute embolism and thrombosis of inferior vena cava: Secondary | ICD-10-CM

## 2011-03-02 DIAGNOSIS — M329 Systemic lupus erythematosus, unspecified: Secondary | ICD-10-CM

## 2011-03-02 DIAGNOSIS — Z7901 Long term (current) use of anticoagulants: Secondary | ICD-10-CM

## 2011-03-02 DIAGNOSIS — Z8672 Personal history of thrombophlebitis: Secondary | ICD-10-CM

## 2011-03-02 DIAGNOSIS — R760 Raised antibody titer: Secondary | ICD-10-CM

## 2011-03-02 LAB — POCT INR: INR: 2.7

## 2011-03-02 NOTE — Progress Notes (Unsigned)
Cont same dose.  Will check PT/INR next week at [facility].  Tenativiely, will schedule next PT/INR in 1 month on 03/30/11 at 8am and may change depending on lab result next week.

## 2011-03-07 ENCOUNTER — Encounter: Payer: 59 | Admitting: Women's Health

## 2011-03-14 ENCOUNTER — Ambulatory Visit (INDEPENDENT_AMBULATORY_CARE_PROVIDER_SITE_OTHER): Payer: 59 | Admitting: Women's Health

## 2011-03-14 ENCOUNTER — Other Ambulatory Visit (HOSPITAL_COMMUNITY)
Admission: RE | Admit: 2011-03-14 | Discharge: 2011-03-14 | Disposition: A | Discharge status: Home / Self Care | Payer: 59 | Source: Ambulatory Visit | Attending: Obstetrics and Gynecology | Admitting: Obstetrics and Gynecology

## 2011-03-14 ENCOUNTER — Encounter: Payer: Self-pay | Admitting: Women's Health

## 2011-03-14 VITALS — BP 116/70 | Ht 64.0 in | Wt 248.0 lb

## 2011-03-14 DIAGNOSIS — N898 Other specified noninflammatory disorders of vagina: Secondary | ICD-10-CM

## 2011-03-14 DIAGNOSIS — R51 Headache: Secondary | ICD-10-CM | POA: Insufficient documentation

## 2011-03-14 DIAGNOSIS — A64 Unspecified sexually transmitted disease: Secondary | ICD-10-CM | POA: Insufficient documentation

## 2011-03-14 DIAGNOSIS — M797 Fibromyalgia: Secondary | ICD-10-CM | POA: Insufficient documentation

## 2011-03-14 DIAGNOSIS — Z01419 Encounter for gynecological examination (general) (routine) without abnormal findings: Secondary | ICD-10-CM

## 2011-03-14 DIAGNOSIS — IMO0001 Reserved for inherently not codable concepts without codable children: Secondary | ICD-10-CM

## 2011-03-14 DIAGNOSIS — L293 Anogenital pruritus, unspecified: Secondary | ICD-10-CM

## 2011-03-14 DIAGNOSIS — R519 Headache, unspecified: Secondary | ICD-10-CM | POA: Insufficient documentation

## 2011-03-14 MED ORDER — TERCONAZOLE 0.4 % VA CREA: 1.0000 | TOPICAL_CREAM | Freq: Every day | VAGINAL | Status: AC

## 2011-03-14 NOTE — Progress Notes (Signed)
[name] 12-12-1975 [id number]    History:    The patient presents for annual exam.  Monthly 4-6 day cycle using withdrawal for contraception. Lupus on Coumadin, ASA, Plaquanil per primary care. History of DVT in pregnancy while on Lovenox. History of normal Paps.   Past medical history, past surgical history, family history and social history were all reviewed and documented in the EPIC chart. Son Madelin Rear 6, doing well. Antiphospholipid antibody syndrome and fibromyalgia history.   ROS:  A  ROS was performed and pertinent positives and negatives are included in the history.  Exam:  Filed Vitals:   03/14/11 0910  BP: 116/70    General appearance:  Normal Head/Neck:  Normal, without cervical or supraclavicular adenopathy. Thyroid:  Symmetrical, normal in size, without palpable masses or nodularity. Respiratory  Effort:  Normal  Auscultation:  Clear without wheezing or rhonchi Cardiovascular  Auscultation:  Regular rate, without rubs, murmurs or gallops  Edema/varicosities:  Not grossly evident Abdominal  Soft,nontender, without masses, guarding or rebound.  Liver/spleen:  No organomegaly noted  Hernia:  None appreciated  Skin  Inspection:  Grossly normal  Palpation:  Grossly normal Neurologic/psychiatric  Orientation:  Normal with appropriate conversation.  Mood/affect:  Normal  Genitourinary    Breasts: Examined lying and sitting.     Right: Without masses, retractions, discharge or axillary adenopathy.     Left: Without masses, retractions, discharge or axillary adenopathy.   Inguinal/mons:  Normal without inguinal adenopathy  External genitalia:  Normal  BUS/Urethra/Skene's glands:  Normal  Bladder:  Normal  Vagina:  Normal  Cervix:  Normal  Uterus:   normal in size, shape and contour.  Midline and mobile  Adnexa/parametria:     Rt: Without masses or tenderness.   Lt: Without masses or tenderness.  Anus and perineum: Normal  Digital rectal exam: Normal  sphincter tone without palpated masses or tenderness  Assessment/Plan:  36 y.o. MWF G2 P1 for annual exam with complaints of joints/muscle problems related to lupus.   Lupus/antiphospholipid antibody syndrome/fibromyalgia-primary care lab and meds. Obesity  Plan: Contraception reviewed and declined will continue with withdrawal. Reviewed high-risk pregnancy return to office if has missed cycle. SBE's, exercise, decreasing calories for weight loss and MVI daily encouraged. Pap only   [facility], 12:53 PM 03/14/2011

## 2011-03-21 ENCOUNTER — Other Ambulatory Visit: Payer: Self-pay | Admitting: Pharmacist

## 2011-03-21 DIAGNOSIS — R894 Abnormal immunological findings in specimens from other organs, systems and tissues: Secondary | ICD-10-CM

## 2011-03-21 DIAGNOSIS — Z8672 Personal history of thrombophlebitis: Secondary | ICD-10-CM

## 2011-03-30 ENCOUNTER — Ambulatory Visit (HOSPITAL_BASED_OUTPATIENT_CLINIC_OR_DEPARTMENT_OTHER): Payer: 59 | Admitting: Pharmacist

## 2011-03-30 ENCOUNTER — Other Ambulatory Visit (HOSPITAL_BASED_OUTPATIENT_CLINIC_OR_DEPARTMENT_OTHER): Payer: 59 | Admitting: Lab

## 2011-03-30 DIAGNOSIS — R760 Raised antibody titer: Secondary | ICD-10-CM

## 2011-03-30 DIAGNOSIS — M329 Systemic lupus erythematosus, unspecified: Secondary | ICD-10-CM

## 2011-03-30 DIAGNOSIS — Z7901 Long term (current) use of anticoagulants: Secondary | ICD-10-CM

## 2011-03-30 DIAGNOSIS — R894 Abnormal immunological findings in specimens from other organs, systems and tissues: Secondary | ICD-10-CM

## 2011-03-30 DIAGNOSIS — Z5181 Encounter for therapeutic drug level monitoring: Secondary | ICD-10-CM

## 2011-03-30 LAB — COMPREHENSIVE METABOLIC PANEL
ALT: 16 U/L (ref 0–35)
Alkaline Phosphatase: 61 U/L (ref 39–117)
Chloride: 103 mEq/L (ref 96–112)
Potassium: 4.2 mEq/L (ref 3.5–5.3)
Total Bilirubin: 0.4 mg/dL (ref 0.3–1.2)
Total Protein: 6.4 g/dL (ref 6.0–8.3)

## 2011-03-30 LAB — PROTIME-INR: Protime: 31.2 Seconds — ABNORMAL HIGH (ref 10.6–13.4)

## 2011-03-30 LAB — CBC WITH DIFFERENTIAL/PLATELET
BASO%: 0.8 % (ref 0.0–2.0)
Eosinophils Absolute: 0.2 10*3/uL (ref 0.0–0.5)
LYMPH%: 20.2 % (ref 14.0–49.7)
MCHC: 34 g/dL (ref 31.5–36.0)
MCV: 88 fL (ref 79.5–101.0)
MONO%: 6.9 % (ref 0.0–14.0)
NEUT%: 69.4 % (ref 38.4–76.8)
Platelets: 354 10*3/uL (ref 145–400)
RBC: 4.34 10*6/uL (ref 3.70–5.45)

## 2011-03-30 NOTE — Progress Notes (Signed)
INR therapeutic (2.6).  No complaints.  Will have pt continue current dose of 4mg  daily except 5mg  on Tuesdays and Fridays.  Will recheck INR in 4 weeks.

## 2011-04-12 ENCOUNTER — Other Ambulatory Visit: Payer: 59 | Admitting: Lab

## 2011-04-14 ENCOUNTER — Telehealth: Payer: Self-pay | Admitting: Oncology

## 2011-04-14 NOTE — Telephone Encounter (Signed)
Pt called and wants to r/s appt to May 2013 called [place] and left message regarding appt change

## 2011-04-18 ENCOUNTER — Other Ambulatory Visit: Payer: 59 | Admitting: Lab

## 2011-04-18 ENCOUNTER — Ambulatory Visit: Payer: 59 | Admitting: Oncology

## 2011-04-27 ENCOUNTER — Telehealth: Payer: Self-pay | Admitting: Pharmacist

## 2011-04-27 ENCOUNTER — Ambulatory Visit: Payer: 59

## 2011-04-27 ENCOUNTER — Other Ambulatory Visit: Payer: 59

## 2011-04-27 NOTE — Telephone Encounter (Signed)
Called and left vm for patient to reschedule her missed appt today.

## 2011-04-27 NOTE — Telephone Encounter (Signed)
Patient returned my call to discuss her Anticoag appt.  She had not put our appt on her calendar, and would not have been able to make the appointment anyway since she had an appt at [facility] today.  They did draw an INR at [facility], so she told us to expect a fax with the results in a day or two.  We will follow up with her by phone once we receive the results of today's INR.

## 2011-05-02 ENCOUNTER — Other Ambulatory Visit: Payer: Self-pay | Admitting: Pharmacist

## 2011-05-02 ENCOUNTER — Ambulatory Visit: Payer: Self-pay | Admitting: Pharmacist

## 2011-05-02 DIAGNOSIS — R760 Raised antibody titer: Secondary | ICD-10-CM

## 2011-05-02 DIAGNOSIS — Z7901 Long term (current) use of anticoagulants: Secondary | ICD-10-CM

## 2011-05-02 MED ORDER — WARFARIN SODIUM 5 MG PO TABS: ORAL_TABLET | ORAL | Status: DC

## 2011-05-02 NOTE — Patient Instructions (Addendum)
INR drawn at [facility] on 04/27/11. Continue 4mg  daily except 5mg  on Tu & Fri.  Pt has been taking 4mg  daily except 6mg  one time per week because she ran out of her 5mg  tablets. Prescription refill called to her pharmacy. Since INR from 04/27/11, will recheck INR/Xa level on 05/05/11 before making dose adjustments. She has had therapeutic INR's on this dose since 02/23/11.

## 2011-05-02 NOTE — Progress Notes (Signed)
INR drawn at [facility] on 04/27/11. Continue 4mg  daily except 5mg  on Tu & Fri.  (Pt has been taking 4mg  daily except 6mg  one time per week). She ran out of her 5mg  tablets. Prescription refill called to her pharmacy for her 5mg  tablets. Since INR from 04/27/11, will recheck INR/Xa level on 05/05/11 before making dose adjustments. She has had therapeutic INR's on this dose since 02/23/11.

## 2011-05-03 ENCOUNTER — Other Ambulatory Visit: Payer: Self-pay | Admitting: Pharmacist

## 2011-05-05 ENCOUNTER — Ambulatory Visit (HOSPITAL_BASED_OUTPATIENT_CLINIC_OR_DEPARTMENT_OTHER): Payer: 59 | Admitting: Pharmacist

## 2011-05-05 ENCOUNTER — Other Ambulatory Visit (HOSPITAL_BASED_OUTPATIENT_CLINIC_OR_DEPARTMENT_OTHER): Payer: 59

## 2011-05-05 DIAGNOSIS — I824Z9 Acute embolism and thrombosis of unspecified deep veins of unspecified distal lower extremity: Secondary | ICD-10-CM

## 2011-05-05 DIAGNOSIS — R760 Raised antibody titer: Secondary | ICD-10-CM

## 2011-05-05 DIAGNOSIS — Z8672 Personal history of thrombophlebitis: Secondary | ICD-10-CM

## 2011-05-05 DIAGNOSIS — Z7901 Long term (current) use of anticoagulants: Secondary | ICD-10-CM

## 2011-05-05 DIAGNOSIS — R894 Abnormal immunological findings in specimens from other organs, systems and tissues: Secondary | ICD-10-CM

## 2011-05-05 LAB — PROTIME-INR

## 2011-05-05 LAB — POCT INR: INR: 1.8

## 2011-05-05 NOTE — Patient Instructions (Signed)
Increase dose to 5mg  on Sun, Tue and Fri and 4 mg on other days. Return to clinic in 2 weeks. We will get in touch with you when your anti-Xa levels come back today.

## 2011-05-06 LAB — CHROMOGENIC FACTOR X: CHROM XA: 50.6 % — ABNORMAL LOW (ref 86–146)

## 2011-05-13 ENCOUNTER — Telehealth: Payer: Self-pay | Admitting: Pharmacist

## 2011-05-13 NOTE — Telephone Encounter (Signed)
Informed pt of her Xa level results from 3/28 = 50.6%. Her Coumadin dose was also increased on 05/05/11 and she has a f/u on 05/19/11. Labs will be rechecked on 05/19/11. She can call us back at [phone] with any questions.

## 2011-05-19 ENCOUNTER — Ambulatory Visit (HOSPITAL_BASED_OUTPATIENT_CLINIC_OR_DEPARTMENT_OTHER): Payer: 59 | Admitting: Pharmacist

## 2011-05-19 ENCOUNTER — Other Ambulatory Visit: Payer: 59 | Admitting: Lab

## 2011-05-19 DIAGNOSIS — Z8672 Personal history of thrombophlebitis: Secondary | ICD-10-CM

## 2011-05-19 DIAGNOSIS — R894 Abnormal immunological findings in specimens from other organs, systems and tissues: Secondary | ICD-10-CM

## 2011-05-19 DIAGNOSIS — R760 Raised antibody titer: Secondary | ICD-10-CM

## 2011-05-19 DIAGNOSIS — Z7901 Long term (current) use of anticoagulants: Secondary | ICD-10-CM

## 2011-05-19 LAB — PROTIME-INR
INR: 1.5 — ABNORMAL LOW (ref 2.00–3.50)
Protime: 18 Seconds — ABNORMAL HIGH (ref 10.6–13.4)

## 2011-05-19 NOTE — Progress Notes (Signed)
INR subtherapeutic today (1.5).  Pt left for vacation last week and forgot her Coumadin.  [place] but her son got sick with Norovirus.  She spent the entirety of the vacation nursing him, and then became sick herself.  When she returned home, she noticed that she was out of her 4mg  pills, so took 5mg  daily for about 4-5 days.  She notes that during this time, she kicked her bed hard when transporting her sleeping son, and a massive bruise developed on her left shin about the size of an apple.  She thought that because of the size of this bruise she might be supratherapeutic, and was surprised by her INR today.  No other problems with bleeding or bruising.  No s/s of clotting noted.   A/P: Her INR today may not have reached steady state since restarting her Coumadin.  Will have her restart the last prescribed dose of 4mg  daily except 5mg  on Sun, Tue and Fri.  Will recheck INR in 1 week to ensure return to therapeutic range.

## 2011-05-26 ENCOUNTER — Ambulatory Visit (HOSPITAL_BASED_OUTPATIENT_CLINIC_OR_DEPARTMENT_OTHER): Payer: 59 | Admitting: Pharmacist

## 2011-05-26 ENCOUNTER — Other Ambulatory Visit (HOSPITAL_BASED_OUTPATIENT_CLINIC_OR_DEPARTMENT_OTHER): Payer: 59 | Admitting: Lab

## 2011-05-26 DIAGNOSIS — Z8672 Personal history of thrombophlebitis: Secondary | ICD-10-CM

## 2011-05-26 DIAGNOSIS — R894 Abnormal immunological findings in specimens from other organs, systems and tissues: Secondary | ICD-10-CM

## 2011-05-26 DIAGNOSIS — Z7901 Long term (current) use of anticoagulants: Secondary | ICD-10-CM

## 2011-05-26 DIAGNOSIS — I824Z9 Acute embolism and thrombosis of unspecified deep veins of unspecified distal lower extremity: Secondary | ICD-10-CM

## 2011-05-26 DIAGNOSIS — R760 Raised antibody titer: Secondary | ICD-10-CM

## 2011-05-26 LAB — PROTIME-INR
INR: 1.6 — ABNORMAL LOW (ref 2.00–3.50)
Protime: 19.2 Seconds — ABNORMAL HIGH (ref 10.6–13.4)

## 2011-05-26 NOTE — Progress Notes (Signed)
INR subtherapeutic today (1.6).  Pt has taken her Coumadin exactly as instructed.  No problems with bleeding or bruising.  Her diet has markedly changed over the last week.  She has eaten salads almost daily, sometime for multiple meals, since she joined [employer].  In light of her diet change, which is likely to persist for the near future, will increase pt's dose to 5mg  daily except 4mg  on Tuesday and Friday.  Will recheck INR in a week at [facility].  She already has an existing appt on Wednesday, April 24th there.

## 2011-06-02 ENCOUNTER — Ambulatory Visit (HOSPITAL_BASED_OUTPATIENT_CLINIC_OR_DEPARTMENT_OTHER): Payer: 59 | Admitting: Pharmacist

## 2011-06-02 ENCOUNTER — Other Ambulatory Visit: Payer: Self-pay | Admitting: *Deleted

## 2011-06-02 DIAGNOSIS — Z7901 Long term (current) use of anticoagulants: Secondary | ICD-10-CM

## 2011-06-02 DIAGNOSIS — R760 Raised antibody titer: Secondary | ICD-10-CM

## 2011-06-02 MED ORDER — WARFARIN SODIUM 5 MG PO TABS: ORAL_TABLET | ORAL | Status: DC

## 2011-06-02 NOTE — Progress Notes (Signed)
INR subtherapeutic from 06/01/11 (1.26) despite increase in dose, including the patient's deviation to 5mg  daily.  She has not changed her medications, and her diet is still high in salads, but consistent with her intake last week.  No other complaints.  She is surprised by her low INR.  Overall, unclear etiology for decreased INR, but I have noticed that her INR has been steadily decreasing since March.  Will increase her dose by 33% today to 7.5mg  daily except 5mg  on MWF.  Recheck INR in 5 days to assess increased dose and ensure return to therapeutic range.

## 2011-06-07 ENCOUNTER — Other Ambulatory Visit (HOSPITAL_BASED_OUTPATIENT_CLINIC_OR_DEPARTMENT_OTHER): Payer: 59 | Admitting: Lab

## 2011-06-07 ENCOUNTER — Ambulatory Visit (HOSPITAL_BASED_OUTPATIENT_CLINIC_OR_DEPARTMENT_OTHER): Payer: 59 | Admitting: Pharmacist

## 2011-06-07 DIAGNOSIS — Z8672 Personal history of thrombophlebitis: Secondary | ICD-10-CM

## 2011-06-07 DIAGNOSIS — Z7901 Long term (current) use of anticoagulants: Secondary | ICD-10-CM

## 2011-06-07 DIAGNOSIS — R894 Abnormal immunological findings in specimens from other organs, systems and tissues: Secondary | ICD-10-CM

## 2011-06-07 DIAGNOSIS — R760 Raised antibody titer: Secondary | ICD-10-CM

## 2011-06-07 LAB — PROTIME-INR
INR: 1.4 — ABNORMAL LOW (ref 2.00–3.50)
Protime: 16.8 Seconds — ABNORMAL HIGH (ref 10.6–13.4)

## 2011-06-07 NOTE — Progress Notes (Signed)
INR remains subtherapeutic today (1.4), although increased from her last visit.  No missed doses.  No changes in meds or diet.  Although her diet is consistent now, it contains significantly more Vitamin K compared to last month.  Will increase dose to 7.5mg  daily today and recheck INR in 6 days.

## 2011-06-13 ENCOUNTER — Other Ambulatory Visit: Payer: 59 | Admitting: Lab

## 2011-06-13 ENCOUNTER — Ambulatory Visit: Payer: 59 | Admitting: Pharmacist

## 2011-06-13 DIAGNOSIS — Z8672 Personal history of thrombophlebitis: Secondary | ICD-10-CM

## 2011-06-13 DIAGNOSIS — R760 Raised antibody titer: Secondary | ICD-10-CM

## 2011-06-13 DIAGNOSIS — R894 Abnormal immunological findings in specimens from other organs, systems and tissues: Secondary | ICD-10-CM

## 2011-06-13 DIAGNOSIS — Z7901 Long term (current) use of anticoagulants: Secondary | ICD-10-CM

## 2011-06-13 LAB — PROTIME-INR
INR: 3 (ref 2.00–3.50)
Protime: 36 Seconds — ABNORMAL HIGH (ref 10.6–13.4)

## 2011-06-13 LAB — POCT INR: INR: 3

## 2011-06-13 NOTE — Patient Instructions (Addendum)
INR therapeutic now. Will decrease Coumadin dose slightly to 7.5mg  daily except 5mg  on Mondays (from 7.5mg  daily). Recheck INR in 10days.

## 2011-06-13 NOTE — Progress Notes (Signed)
INR therapeutic now. Significant increase from last week. INR = 1.4 on 06/07/11 to 3 today. This is the highest Coumadin dose for pt. She is trying to loose weight and her diet contains frequent salads. She has been eating salads ~ every other day for the last week.She plans to increase to daily salads now that INR within goal. Will decrease Coumadin dose slightly to 7.5mg  daily except 5mg  on Mondays. Pt has been on 7.5mg  daily for 1 week. Will recheck INR in 10 days. Pt aware to call us if any unusual bruising or if she experiences any bleeding.

## 2011-06-22 ENCOUNTER — Ambulatory Visit: Payer: 59

## 2011-06-22 ENCOUNTER — Other Ambulatory Visit: Payer: 59

## 2011-06-24 ENCOUNTER — Ambulatory Visit: Payer: 59

## 2011-06-24 ENCOUNTER — Other Ambulatory Visit: Payer: 59 | Admitting: Lab

## 2011-06-24 ENCOUNTER — Ambulatory Visit (HOSPITAL_BASED_OUTPATIENT_CLINIC_OR_DEPARTMENT_OTHER): Payer: 59 | Admitting: Lab

## 2011-06-24 ENCOUNTER — Ambulatory Visit: Payer: 59 | Admitting: Pharmacist

## 2011-06-24 DIAGNOSIS — Z8672 Personal history of thrombophlebitis: Secondary | ICD-10-CM

## 2011-06-24 DIAGNOSIS — R894 Abnormal immunological findings in specimens from other organs, systems and tissues: Secondary | ICD-10-CM

## 2011-06-24 DIAGNOSIS — Z7901 Long term (current) use of anticoagulants: Secondary | ICD-10-CM

## 2011-06-24 DIAGNOSIS — I82220 Acute embolism and thrombosis of inferior vena cava: Secondary | ICD-10-CM

## 2011-06-24 DIAGNOSIS — Z5181 Encounter for therapeutic drug level monitoring: Secondary | ICD-10-CM

## 2011-06-24 DIAGNOSIS — R760 Raised antibody titer: Secondary | ICD-10-CM

## 2011-06-24 LAB — PROTHROMBIN TIME
INR: 4.5 — ABNORMAL HIGH (ref <1.50)
Prothrombin Time: 43.4 seconds — ABNORMAL HIGH (ref 11.6–15.2)

## 2011-06-24 LAB — PROTIME-INR

## 2011-06-24 MED ORDER — WARFARIN SODIUM 4 MG PO TABS: ORAL_TABLET | ORAL | Status: DC

## 2011-06-24 NOTE — Progress Notes (Signed)
INR = 4.5 (by venopuncture; sent out for confirmation) on 7.5 mg/day; 5 mg on Mon. Pt has not taken additional doses.  No med changes.  She cannot determine the cause for the rise in INR. She has been eating more fruits/vegetables; 1 salad/day.  She has lost 9 lbs in 3 weeks. INR supratherapeutic.  Hold x 2 doses then 6 mg/day.  I called in Warfarin 4 mg to [facility]. Repeat protime in 1 week. [name], Pharm.D.

## 2011-07-01 ENCOUNTER — Ambulatory Visit (HOSPITAL_BASED_OUTPATIENT_CLINIC_OR_DEPARTMENT_OTHER): Payer: 59 | Admitting: Oncology

## 2011-07-01 ENCOUNTER — Telehealth: Payer: Self-pay | Admitting: Oncology

## 2011-07-01 ENCOUNTER — Other Ambulatory Visit (HOSPITAL_BASED_OUTPATIENT_CLINIC_OR_DEPARTMENT_OTHER): Payer: 59 | Admitting: Lab

## 2011-07-01 ENCOUNTER — Ambulatory Visit: Payer: 59 | Admitting: Pharmacist

## 2011-07-01 VITALS — BP 106/75 | HR 69 | Temp 96.8°F | Ht 65.0 in | Wt 247.5 lb

## 2011-07-01 DIAGNOSIS — D6859 Other primary thrombophilia: Secondary | ICD-10-CM

## 2011-07-01 DIAGNOSIS — Z8672 Personal history of thrombophlebitis: Secondary | ICD-10-CM

## 2011-07-01 DIAGNOSIS — Z7901 Long term (current) use of anticoagulants: Secondary | ICD-10-CM

## 2011-07-01 DIAGNOSIS — I82409 Acute embolism and thrombosis of unspecified deep veins of unspecified lower extremity: Secondary | ICD-10-CM

## 2011-07-01 DIAGNOSIS — D6861 Antiphospholipid syndrome: Secondary | ICD-10-CM

## 2011-07-01 DIAGNOSIS — R760 Raised antibody titer: Secondary | ICD-10-CM

## 2011-07-01 DIAGNOSIS — R894 Abnormal immunological findings in specimens from other organs, systems and tissues: Secondary | ICD-10-CM

## 2011-07-01 DIAGNOSIS — M329 Systemic lupus erythematosus, unspecified: Secondary | ICD-10-CM

## 2011-07-01 DIAGNOSIS — I82402 Acute embolism and thrombosis of unspecified deep veins of left lower extremity: Secondary | ICD-10-CM

## 2011-07-01 LAB — PROTIME-INR: Protime: 28.8 Seconds — ABNORMAL HIGH (ref 10.6–13.4)

## 2011-07-01 NOTE — Progress Notes (Signed)
Hematology and Oncology Follow Up Visit  JANI MORONTA [id number] 11-05-1975 36 y.o. 07/01/2011 2:35 PM   Principle Diagnosis: Encounter Diagnoses  Name Primary?  Marland Kitchen Antiphospholipid antibody with hypercoagulable state Yes  . DVT (deep venous thrombosis), left   . Systemic lupus erythematosus      Interim History:    Followup visit for this pleasant 36 year old woman with lupus.  She had a left lower extremity DVT in 08/2004.  She was found to have a positive lupus type anticoagulant.  She had elevated anticardiolipin antibodies against IgG in the past, and low positive against IgM  as well as IgM antibodies against beta 2 glycoprotein 1.  She has a high positive ANA.  She has been maintained on full dose therapeutic Coumadin, now since her DVT, which occurred at the time of her second pregnancy in 2006.  Of note, she had a previous elective termination of her first pregnancy.  Her lupus is currently under good control.  She remains on Plaquenil.  She has been on an previously experimental injectable drug, which she receives by one-hour IV infusion monthly at [facility]. The drug has now been FDA approved. She does believe it is helping her. She has been able to taper her prednisone down to just 2 mg a day. Main lupus symptoms are large joint pains and profound fatigue. She had a recent right plantar fasciitis and just had a steroid injection.  She denies any headache or change in vision, no dyspnea chest pain or palpitations, no change in bowel habit, menstrual cycles are starting to become irregular, she reports no leg pain or swelling.  Her son is now is going to be 17 years old and is doing well. 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  Physical Exam: Blood pressure 106/75, pulse 69, temperature 96.8 F (36 C), temperature source Oral, height 5\' 5"  (1.651 m), weight 247 lb 8 oz (112.265 kg). Wt Readings from Last 3 Encounters:  07/01/11 247 lb 8 oz (112.265 kg)  03/14/11 248 lb (112.492 kg)  05/26/08 265 lb (120.203 kg)     General appearance: Pleasant Caucasian woman HENNT: Pharynx no erythema or exudate Lymph nodes: No adenopathy Breasts: Not examined Lungs: Clear to auscultation resonant to percussion Heart: Regular rhythm no murmur Abdomen: Soft nontender no mass no organomegaly Extremities: No edema no calf tenderness Vascular: No cyanosis Neurologic: Mental status intact, cranial nerves intact, pupils equal reactive to light, optic discs are sharp, motor strength 5 over 5, reflexes 1+ symmetric. Skin: No rash or ecchymosis  Lab Results: Lab Results  Component Value Date   WBC 7.0 03/30/2011   HGB 13.0 03/30/2011   HCT 38.3 03/30/2011   MCV 88.0 03/30/2011   PLT 354 03/30/2011     Chemistry      Component Value Date/Time   NA 138 03/30/2011 0803   K 4.2 03/30/2011 0803   CL 103 03/30/2011 0803   CO2 27 03/30/2011 0803   BUN 9 03/30/2011 0803   CREATININE 0.72 03/30/2011 0803      Component Value Date/Time   CALCIUM 9.0 03/30/2011 0803   ALKPHOS 61 03/30/2011 0803   AST 17 03/30/2011 0803  ALT 16 03/30/2011 0803   BILITOT 0.4 03/30/2011 0803        Impression and Plan: #1. Hypercoagulable state secondary to antiphospholipid antibody syndrome  #2. Status post left lower she may DVT during pregnancy related to #1.  #3. Systemic lupus  I'll see her again in one year. She will continue full dose Coumadin and Coumadin monitoring in our office.    CC:. [facility] [employer], [place]; Dr Virgilio Frees, [place], [address]   Levert Feinstein, MD 5/24/20132:35 PM

## 2011-07-01 NOTE — Telephone Encounter (Signed)
appts made and printed for pt aom °

## 2011-07-01 NOTE — Progress Notes (Signed)
INR therapeutic today (2.4) after holding 2 doses and resuming Coumadin at lower dose of 6mg  daily.  No significant diet changes from before.  Did have another steroid injection into her foot for plantar fascitis pain this week.  No other changes.  No complaints of bleeding or bruising.  Will continue current dose of 6mg  daily, and recheck INR in 1 week to ensure that she stays in therapeutic range.

## 2011-07-07 ENCOUNTER — Ambulatory Visit: Payer: 59

## 2011-07-08 ENCOUNTER — Ambulatory Visit: Payer: 59 | Admitting: Pharmacist

## 2011-07-08 ENCOUNTER — Other Ambulatory Visit (HOSPITAL_BASED_OUTPATIENT_CLINIC_OR_DEPARTMENT_OTHER): Payer: 59 | Admitting: Lab

## 2011-07-08 DIAGNOSIS — R760 Raised antibody titer: Secondary | ICD-10-CM

## 2011-07-08 DIAGNOSIS — Z7901 Long term (current) use of anticoagulants: Secondary | ICD-10-CM

## 2011-07-08 DIAGNOSIS — D6859 Other primary thrombophilia: Secondary | ICD-10-CM

## 2011-07-08 DIAGNOSIS — I82402 Acute embolism and thrombosis of unspecified deep veins of left lower extremity: Secondary | ICD-10-CM

## 2011-07-08 DIAGNOSIS — M329 Systemic lupus erythematosus, unspecified: Secondary | ICD-10-CM

## 2011-07-08 DIAGNOSIS — D6861 Antiphospholipid syndrome: Secondary | ICD-10-CM

## 2011-07-08 LAB — PROTIME-INR: INR: 3.2 (ref 2.00–3.50)

## 2011-07-08 LAB — CBC WITH DIFFERENTIAL/PLATELET
BASO%: 0.9 % (ref 0.0–2.0)
EOS%: 2.5 % (ref 0.0–7.0)
LYMPH%: 23.9 % (ref 14.0–49.7)
MCH: 29.9 pg (ref 25.1–34.0)
MCHC: 33.7 g/dL (ref 31.5–36.0)
MONO#: 0.7 10*3/uL (ref 0.1–0.9)
RBC: 4.49 10*6/uL (ref 3.70–5.45)
WBC: 7.9 10*3/uL (ref 3.9–10.3)
lymph#: 1.9 10*3/uL (ref 0.9–3.3)
nRBC: 0 % (ref 0–0)

## 2011-07-08 LAB — POCT INR: INR: 3.2

## 2011-07-08 NOTE — Progress Notes (Signed)
INR = 3.2 on 6 mg/day Pt has lost 14 lbs recently due to dieting. No complaints today re: anticoag. Continue on 6 mg/day. She goes back to [facility] on 6/19 (?).  She can have her INR tested there & we'll call her w/ results & instructions for dosing. If the appt at [facility] is not 6/19, Kenetha will call us to let us know when she is going.  She stated today that they are getting ready to have staff changes there again.  At some point soon, we may need to call their clinic & make sure they are still willing to check Jehan's INR for [place] & fax [place] the results.  Margarine will let us know when the staff changes so we can do this. [name], Pharm.D.

## 2011-07-09 LAB — COMPREHENSIVE METABOLIC PANEL
ALT: 11 U/L (ref 0–35)
BUN: 8 mg/dL (ref 6–23)
CO2: 24 mEq/L (ref 19–32)
Calcium: 8.9 mg/dL (ref 8.4–10.5)
Chloride: 106 mEq/L (ref 96–112)
Creatinine, Ser: 0.77 mg/dL (ref 0.50–1.10)
Total Bilirubin: 0.4 mg/dL (ref 0.3–1.2)

## 2011-07-09 LAB — LACTATE DEHYDROGENASE: LDH: 138 U/L (ref 94–250)

## 2011-08-05 ENCOUNTER — Telehealth: Payer: Self-pay | Admitting: Pharmacist

## 2011-08-05 NOTE — Telephone Encounter (Signed)
Called to ask pt if she had been to [facility] lately to get her INR checked.  She apparently went on Wednesday this week, but we never received faxed results of her INR.  Pt will call [facility] to investigate and find out whether we need to speak with new staff about faxing her results to our clinic.

## 2011-08-15 ENCOUNTER — Encounter: Payer: Self-pay | Admitting: Pharmacist

## 2011-08-15 ENCOUNTER — Telehealth: Payer: Self-pay | Admitting: Pharmacist

## 2011-08-15 NOTE — Progress Notes (Signed)
Received lab results from [facility] from the end of June. PT = 27.1, INR = 2.55 Pt will return to [facility] for repeat INR ~7/14 or 08/28/11.

## 2011-08-15 NOTE — Progress Notes (Signed)
I s/w pt over phone today since we never received INR result from [facility] over 2 weeks ago. Yaretzy never called [facility] to check on the results.  She stated she would call Melissa Trader at [facility] now & have her fax [place] her INR result.  I gave Renleigh our fax & ph # to give to [facility]. Pt returns to [facility] ~08/21/11 or 08/28/11 & will have INR checked then. [name], Pharm.D.

## 2011-08-20 ENCOUNTER — Emergency Department (HOSPITAL_COMMUNITY): Payer: 59

## 2011-08-20 ENCOUNTER — Emergency Department (HOSPITAL_COMMUNITY)
Admission: EM | Admit: 2011-08-20 | Discharge: 2011-08-21 | Disposition: A | Discharge status: Home / Self Care | Payer: 59 | Attending: Emergency Medicine | Admitting: Emergency Medicine

## 2011-08-20 ENCOUNTER — Encounter (HOSPITAL_COMMUNITY): Payer: Self-pay | Admitting: Emergency Medicine

## 2011-08-20 DIAGNOSIS — Z86718 Personal history of other venous thrombosis and embolism: Secondary | ICD-10-CM | POA: Insufficient documentation

## 2011-08-20 DIAGNOSIS — Z7901 Long term (current) use of anticoagulants: Secondary | ICD-10-CM | POA: Insufficient documentation

## 2011-08-20 DIAGNOSIS — M329 Systemic lupus erythematosus, unspecified: Secondary | ICD-10-CM | POA: Insufficient documentation

## 2011-08-20 DIAGNOSIS — Z79899 Other long term (current) drug therapy: Secondary | ICD-10-CM | POA: Insufficient documentation

## 2011-08-20 DIAGNOSIS — IMO0001 Reserved for inherently not codable concepts without codable children: Secondary | ICD-10-CM | POA: Insufficient documentation

## 2011-08-20 DIAGNOSIS — R091 Pleurisy: Secondary | ICD-10-CM | POA: Insufficient documentation

## 2011-08-20 DIAGNOSIS — R0602 Shortness of breath: Secondary | ICD-10-CM | POA: Insufficient documentation

## 2011-08-20 LAB — BASIC METABOLIC PANEL
BUN: 7 mg/dL (ref 6–23)
Calcium: 9 mg/dL (ref 8.4–10.5)
Creatinine, Ser: 0.64 mg/dL (ref 0.50–1.10)
GFR calc Af Amer: >90 mL/min (ref >90)
GFR calc non Af Amer: >90 mL/min (ref >90)

## 2011-08-20 LAB — CBC WITH DIFFERENTIAL/PLATELET
Basophils Relative: 0 % (ref 0–1)
Eosinophils Absolute: 0.1 10*3/uL (ref 0.0–0.7)
HCT: 37.2 % (ref 36.0–46.0)
Hemoglobin: 12.4 g/dL (ref 12.0–15.0)
MCH: 29.4 pg (ref 26.0–34.0)
MCHC: 33.3 g/dL (ref 30.0–36.0)
Monocytes Absolute: 1.1 10*3/uL — ABNORMAL HIGH (ref 0.1–1.0)
Monocytes Relative: 10 % (ref 3–12)

## 2011-08-20 MED ORDER — SODIUM CHLORIDE 0.9 % IV BOLUS (SEPSIS)
500.0000 mL | Freq: Once | INTRAVENOUS | Status: AC
  Administered 2011-08-20: 500 mL via INTRAVENOUS

## 2011-08-20 MED ORDER — FENTANYL CITRATE 0.05 MG/ML IJ SOLN
50.0000 ug | Freq: Once | INTRAMUSCULAR | Status: AC
  Administered 2011-08-20: 50 ug via INTRAVENOUS
  Filled 2011-08-20: qty 2

## 2011-08-20 MED ORDER — IOHEXOL 350 MG/ML SOLN
100.0000 mL | Freq: Once | INTRAVENOUS | Status: AC | PRN
  Administered 2011-08-20: 100 mL via INTRAVENOUS

## 2011-08-20 MED ORDER — TECHNETIUM TO 99M ALBUMIN AGGREGATED
3.0000 | Freq: Once | INTRAVENOUS | Status: AC | PRN
Start: 2011-08-20 — End: 2011-08-20
  Administered 2011-08-20: 3 via INTRAVENOUS

## 2011-08-20 NOTE — ED Provider Notes (Addendum)
History     CSN: [id number]  Arrival date & time 08/20/11  4098   First MD Initiated Contact with Patient 08/20/11 2018      Chief Complaint  Patient presents with  . Pleurisy    (Consider location/radiation/quality/duration/timing/severity/associated sxs/prior treatment) The history is provided by the patient.   patient's had shortness of breath and pleuritic chest pain over the last week. She states she's had an increase in her difficulty breathing in the last week. She states she is unable to take a deep breath. States the pain was in the front of her chest and now is in the back. No fevers. She has a history of lupus and antiphospholipid antibody syndrome. She is previous DVTs. She also has a history of pericarditis and previous pleurisy. She's currently on Coumadin.  Past Medical History  Diagnosis Date  . DVT (deep vein thrombosis) in pregnancy   . 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 Antiphospholipid antibody syndrome   . Fibromyalgia   . Headache   . STD (sexually transmitted disease)     Condyloma    Past Surgical History  Procedure Date  . Cesarean section   . Colposcopy     Family History  Problem Relation Age of Onset  . Hypertension Father   . Thyroid disease Mother   . Diabetes Paternal Grandmother   . Cancer Paternal Grandmother     Stomach and Lung cancer    History  Substance Use Topics  . Smoking status: Former Games developer  . Smokeless tobacco: Never Used  . Alcohol Use: No     occas    OB History    Grav Para Term Preterm Abortions TAB SAB Ect Mult Living   2 1 1  1  1   1       Review of Systems  Constitutional: Negative for activity change and appetite change.  HENT: Negative for neck stiffness.   Eyes: Negative for pain.  Respiratory: Positive for cough and shortness of breath. Negative for chest tightness.   Cardiovascular: Positive for chest pain. Negative for leg swelling.  Gastrointestinal: Negative for nausea,  vomiting, abdominal pain and diarrhea.  Genitourinary: Negative for flank pain.  Musculoskeletal: Negative for back pain.  Skin: Negative for rash.  Neurological: Negative for weakness, numbness and headaches.  Psychiatric/Behavioral: Negative for behavioral problems.    Allergies  Review of patient's allergies indicates no known allergies.  Home Medications   Current Outpatient Rx  Name Route Sig Dispense Refill  . CALCIUM CARBONATE-VITAMIN D 500-200 MG-UNIT PO TABS Oral Take 2 tablets by mouth daily.      Marland Kitchen DIPHENHYDRAMINE-APAP (SLEEP) 25-500 MG PO TABS Oral Take 1 tablet by mouth at bedtime as needed. Pain and sleep    . HYDROXYCHLOROQUINE SULFATE 200 MG PO TABS Oral Take 200 mg by mouth 2 (two) times daily.     Marland Kitchen PREDNISONE 1 MG PO TABS Oral Take 2 mg by mouth Daily.    . WARFARIN SODIUM 4 MG PO TABS Oral Take 6 mg by mouth at bedtime. Take 6 mg PO Daily or as directed    . OXYCODONE-ACETAMINOPHEN 5-325 MG PO TABS Oral Take 1-2 tablets by mouth every 6 (six) hours as needed for pain. 10 tablet 0  . PREDNISONE 20 MG PO TABS Oral Take 2 tablets (40 mg total) by mouth daily. 6 tablet 0    BP 110/40  Pulse 83  Temp 98.2 F (36.8 C) (Oral)  Resp 17  SpO2 100%  LMP 07/31/2011  Physical Exam  Nursing note and vitals reviewed. Constitutional: She is oriented to person, place, and time. She appears well-developed and well-nourished.  HENT:  Head: Normocephalic and atraumatic.  Eyes: EOM are normal. Pupils are equal, round, and reactive to light.  Neck: Normal range of motion. Neck supple.  Cardiovascular: Regular rhythm and normal heart sounds.   No murmur heard.      Tachycardia  Pulmonary/Chest: Effort normal and breath sounds normal. No respiratory distress. She has no wheezes. She has no rales.  Abdominal: Soft. Bowel sounds are normal. She exhibits no distension. There is no tenderness. There is no rebound and no guarding.  Musculoskeletal: Normal range of motion.    Neurological: She is alert and oriented to person, place, and time. No cranial nerve deficit.  Skin: Skin is warm and dry.  Psychiatric: She has a normal mood and affect. Her speech is normal.    ED Course  Procedures (including critical care time)  Labs Reviewed  CBC WITH DIFFERENTIAL - Abnormal; Notable for the following:    Monocytes Absolute 1.1 (*)     All other components within normal limits  PROTIME-INR - Abnormal; Notable for the following:    Prothrombin Time 29.8 (*)     INR 2.78 (*)     All other components within normal limits  BASIC METABOLIC PANEL   Dg Chest 2 View  08/20/2011  *RADIOLOGY REPORT*  Clinical Data: Pleuritic chest pain.  Shortness of breath.  Lupus.  CHEST - 2 VIEW  Comparison:  05/23/2005  Findings:  The heart size and mediastinal contours are within normal limits.  Both lungs are clear.  The visualized skeletal structures are unremarkable.  IMPRESSION: No active cardiopulmonary disease.  Original Report Authenticated By: Danae Orleans, M.D.   Ct Angio Chest W/cm &/or Wo Cm  08/20/2011  *RADIOLOGY REPORT*  Clinical Data: Chest pain  CT ANGIOGRAPHY CHEST  Technique:  Multidetector CT imaging of the chest using the standard protocol during bolus administration of intravenous contrast. Multiplanar reconstructed images including MIPs were obtained and reviewed to evaluate the vascular anatomy.  Contrast: OMNIPAQUE IOHEXOL 350 MG/ML SOLN  Comparison: 08/20/2011 radiograph, 05/23/2005 CT  Findings: Degraded by artifact from patient respiratory motion. Furthermore, suboptimal contrast bolus timing.  No main pulmonary arterial filling defect or definite lobar branch defect.  However, some of the lobar branches and the more peripheral branches (segmental and subsegmental branches) are inadequately evaluated. Cardiomegaly.  Trace pericardial fluid. Normal caliber aorta however the ascending aorta is poorly evaluated due to motion.  No pleural effusion.  No  intrathoracic lymphadenopathy.  Limited images through the upper abdomen show no acute finding. Small hiatal hernia.  Detailed parenchymal evaluation is degraded by motion. Within this limitation; no focal consolidation.  No pneumothorax.  No suspicious nodularity.  Central airways are degraded by respiratory motion however grossly patent.  No acute osseous finding.  IMPRESSION: Degraded by respiratory motion and suboptimal contrast bolus timing.  No central pulmonary embolism identified. Poorly evaluated more peripheral branches.  Cardiomegaly. Trace pericardial fluid.  Small hiatal hernia.  Otherwise, no acute intrathoracic process identified.  Original Report Authenticated By: Waneta Martins, M.D.     1. Pleurisy     Date: 09/10/2011  Rate: 103  Rhythm: sinus tachycardia  QRS Axis: normal  Intervals: normal  ST/T Wave abnormalities: normal  Conduction Disutrbances:none  Narrative Interpretation:   Old EKG Reviewed: none available     MDM  Patient  with pleuritic chest pain or shortness of breath. Tachycardia. With  her history of hypercoagulable state patient had a CT angiogram. He was indeterminate for pulmonary embolism. She had a VQ scan done which is negative. Patient feels better after treatment will be discharged home with some prednisone. She'll followup with a rheumatologist on Monday. She's also therapeutic on her Coumadin.   Juliet Rude. Rubin Payor, MD 08/21/11 0041  Juliet Rude. Rubin Payor, MD 09/10/11 1520

## 2011-08-20 NOTE — ED Notes (Signed)
Onset of chest pain and shortness of breath previous week. Pt states marked increase in difficulty yesterday while trying to participate in son's baseball activities. Feels like she is unable to take deep breath, pain and tension across shoulders and up into cervical spine.

## 2011-08-21 MED ORDER — OXYCODONE-ACETAMINOPHEN 5-325 MG PO TABS: 1.0000 | ORAL_TABLET | Freq: Four times a day (QID) | ORAL | Status: AC | PRN

## 2011-08-21 MED ORDER — PREDNISONE 20 MG PO TABS: 40.0000 mg | ORAL_TABLET | Freq: Every day | ORAL | Status: AC

## 2011-08-21 MED ORDER — PREDNISONE 20 MG PO TABS
40.0000 mg | ORAL_TABLET | Freq: Once | ORAL | Status: AC
  Administered 2011-08-21: 40 mg via ORAL
  Filled 2011-08-21: qty 2

## 2011-08-22 ENCOUNTER — Ambulatory Visit (HOSPITAL_BASED_OUTPATIENT_CLINIC_OR_DEPARTMENT_OTHER): Payer: 59 | Admitting: Pharmacist

## 2011-08-22 DIAGNOSIS — Z7901 Long term (current) use of anticoagulants: Secondary | ICD-10-CM

## 2011-08-22 DIAGNOSIS — R760 Raised antibody titer: Secondary | ICD-10-CM

## 2011-08-22 NOTE — Progress Notes (Signed)
Spoke to pt on phone. Pt went to ED on 08/20/11 for pleurisy and was given prednisone 40mg  daily to take for 4 days.  Tomorrow is last day of steriods.  INR=2.78 in ED.  Pt continues to take coumadin 6mg  daily.  Pt still needs to make appt at [facility] and will be seen there in the next couple weeks.  I told pt we would touch base with her by 7/26 if we have not heard from [facility].  No other issues or complaints.  Continue Coumadin 6mg  daily

## 2011-09-01 LAB — POCT INR: INR: 2.6

## 2011-09-12 ENCOUNTER — Telehealth: Payer: Self-pay | Admitting: Pharmacist

## 2011-09-12 NOTE — Telephone Encounter (Signed)
Left message for pt to f/u if she was at [facility] on ~ 09/08/11 to have her INR drawn. We have not received the results.  If her appointment at [facility] was rescheduled or the INR was not done, we could reschedule her for a convenient time at [facility]. I asked her to call us back to let us know.  I also called Efraim Kaufmann, RN at [facility] (phone number = [phone]). I asked her to fax the results to [phone].

## 2011-09-15 ENCOUNTER — Telehealth: Payer: Self-pay | Admitting: Pharmacist

## 2011-09-15 ENCOUNTER — Telehealth: Payer: Self-pay | Admitting: *Deleted

## 2011-09-15 ENCOUNTER — Ambulatory Visit (HOSPITAL_BASED_OUTPATIENT_CLINIC_OR_DEPARTMENT_OTHER): Payer: 59 | Admitting: Pharmacist

## 2011-09-15 DIAGNOSIS — Z7901 Long term (current) use of anticoagulants: Secondary | ICD-10-CM

## 2011-09-15 DIAGNOSIS — R760 Raised antibody titer: Secondary | ICD-10-CM

## 2011-09-15 NOTE — Telephone Encounter (Signed)
Received call from [facility] stating pt had called & asked that she send labs to [place] & she wanted to know if we wanted these by phone or fax.  Called Misty Stanley back & she reports that she is coming there for a lupus study & had her PT/INR drawn in addition to research labs.  Asked that she fax the labs to [place] & we will forward to the coumadin clinic.

## 2011-09-15 NOTE — Progress Notes (Signed)
INR from 09/01/11 therapeutic at 2.6 on 6mg  daily. No changes or complaints. Continue current dose, recheck INR again at [facility] with next visit (within the next 2 weeks or so).

## 2011-09-15 NOTE — Telephone Encounter (Signed)
Pt returned call today from voicemail left for her earlier in the week to see if she had gone to [facility] and had an INR drawn around 09/08/11.  Pt verified that she did have labs drawn at [facility], and will call to investigate why we had not yet received results.

## 2011-09-23 ENCOUNTER — Telehealth: Payer: Self-pay | Admitting: Pharmacist

## 2011-09-23 DIAGNOSIS — M797 Fibromyalgia: Secondary | ICD-10-CM

## 2011-09-23 NOTE — Telephone Encounter (Signed)
Pt called to inform the Coumadin of her next visit at [facility] when she will get her INR drawn and to inform us of a new medication that she started.   She will be getting her next labs at [facility] on Tuesday, 09/27/11, and started on Wellbutrin 150mg  BID this week.  This should not pose a drug interaction risk with her Coumadin.  Med list updated in Epic.

## 2011-09-28 ENCOUNTER — Ambulatory Visit: Payer: Self-pay | Admitting: Pharmacist

## 2011-09-28 DIAGNOSIS — Z7901 Long term (current) use of anticoagulants: Secondary | ICD-10-CM

## 2011-09-28 DIAGNOSIS — R760 Raised antibody titer: Secondary | ICD-10-CM

## 2011-09-28 NOTE — Progress Notes (Signed)
INR within goal range.  Pt started Wellbutrin 5 days ago and this does not seem to have effected pts INR.  No other changes.  Pt has appt at [facility] on 10/25/11 and will f/u PT/INR at that time.

## 2011-10-12 ENCOUNTER — Other Ambulatory Visit: Payer: Self-pay | Admitting: *Deleted

## 2011-10-12 DIAGNOSIS — Z7901 Long term (current) use of anticoagulants: Secondary | ICD-10-CM

## 2011-10-12 MED ORDER — WARFARIN SODIUM 4 MG PO TABS: 6.0000 mg | ORAL_TABLET | Freq: Every day | ORAL | Status: DC

## 2011-10-27 ENCOUNTER — Telehealth: Payer: Self-pay | Admitting: Pharmacist

## 2011-10-28 NOTE — Telephone Encounter (Signed)
Pt called to inform us that her appointment at [facility] was changed from 10/25/11 to 10/31/11. Her INR will be checked on 10/31/11.

## 2011-11-01 ENCOUNTER — Telehealth: Payer: Self-pay | Admitting: Pharmacist

## 2011-11-01 NOTE — Telephone Encounter (Signed)
LM for Claudia Desanctis, RN at [facility], (Phone = [phone]) asking her to fax [place] the results of pt's INR drawn on 10/31/11. Please fax results to [phone] Call us with any questions at [phone]

## 2011-11-03 ENCOUNTER — Ambulatory Visit: Payer: Self-pay | Admitting: Pharmacist

## 2011-11-07 ENCOUNTER — Ambulatory Visit: Payer: Self-pay | Admitting: Pharmacist

## 2011-11-07 DIAGNOSIS — Z7901 Long term (current) use of anticoagulants: Secondary | ICD-10-CM

## 2011-11-07 DIAGNOSIS — R760 Raised antibody titer: Secondary | ICD-10-CM

## 2011-11-07 LAB — POCT INR: INR: 3.32

## 2011-11-07 NOTE — Progress Notes (Signed)
No changes to report.  Cont current coumadin dose.  Will f/u PT/INR from [facility] on 11/30/11.

## 2011-11-30 LAB — POCT INR: INR: 4.87

## 2011-12-02 ENCOUNTER — Ambulatory Visit (HOSPITAL_BASED_OUTPATIENT_CLINIC_OR_DEPARTMENT_OTHER): Payer: 59 | Admitting: Pharmacist

## 2011-12-02 DIAGNOSIS — R894 Abnormal immunological findings in specimens from other organs, systems and tissues: Secondary | ICD-10-CM

## 2011-12-02 DIAGNOSIS — Z7901 Long term (current) use of anticoagulants: Secondary | ICD-10-CM

## 2011-12-02 DIAGNOSIS — R760 Raised antibody titer: Secondary | ICD-10-CM

## 2011-12-02 NOTE — Progress Notes (Signed)
INR elevated to close to 5 from 11/30/11 at [facility].  Pt states no medication changes.  I instructed pt to hold coumadin today and tomorrow and take 4mg  on Sunday.  Will check PT/INR on Monday 12/05/11.  Note:  Lab results are dated 11/21/11, but [name] states that she was not at [facility] on 11/21/11.  I have a left a VM with Efraim Kaufmann Trader, RN to ensure these results are for this pt on 11/30/11.  Waiting for return call.

## 2011-12-05 ENCOUNTER — Ambulatory Visit (HOSPITAL_BASED_OUTPATIENT_CLINIC_OR_DEPARTMENT_OTHER): Payer: 59 | Admitting: Pharmacist

## 2011-12-05 ENCOUNTER — Other Ambulatory Visit: Payer: 59 | Admitting: Lab

## 2011-12-05 DIAGNOSIS — R894 Abnormal immunological findings in specimens from other organs, systems and tissues: Secondary | ICD-10-CM

## 2011-12-05 DIAGNOSIS — Z7901 Long term (current) use of anticoagulants: Secondary | ICD-10-CM

## 2011-12-05 DIAGNOSIS — R760 Raised antibody titer: Secondary | ICD-10-CM

## 2011-12-05 DIAGNOSIS — Z8672 Personal history of thrombophlebitis: Secondary | ICD-10-CM

## 2011-12-05 LAB — PROTIME-INR: INR: 1.2 — ABNORMAL LOW (ref 2.00–3.50)

## 2011-12-05 NOTE — Progress Notes (Signed)
Pt held coumadin on Friday and Saturday. She took Coumadin 4mg  on Sunday as instructed. INR dropped quickly. INR below goal today. Begin Coumadin 5mg  daily. Recheck INR in 1 week on 12/12/11; 8am for lab and 8:15am for coumadin clinic. Pt has 4mg  tablets. Prescription for 1mg  tablets called to [facility], Phone:[phone]

## 2011-12-05 NOTE — Patient Instructions (Addendum)
Begin Coumadin 5mg  daily today.  (If you are unable to pick up the prescription for the 1mg  tablets today, you may take 6mg  today and then begin 5mg  daily on 12/06/11.) Recheck INR in 1 week on 12/12/11; 8am for lab and 8:15am for coumadin clinic.

## 2011-12-12 ENCOUNTER — Other Ambulatory Visit (HOSPITAL_BASED_OUTPATIENT_CLINIC_OR_DEPARTMENT_OTHER): Payer: 59 | Admitting: Lab

## 2011-12-12 ENCOUNTER — Ambulatory Visit (HOSPITAL_BASED_OUTPATIENT_CLINIC_OR_DEPARTMENT_OTHER): Payer: 59 | Admitting: Pharmacist

## 2011-12-12 DIAGNOSIS — Z7901 Long term (current) use of anticoagulants: Secondary | ICD-10-CM

## 2011-12-12 DIAGNOSIS — Z8672 Personal history of thrombophlebitis: Secondary | ICD-10-CM

## 2011-12-12 DIAGNOSIS — R894 Abnormal immunological findings in specimens from other organs, systems and tissues: Secondary | ICD-10-CM

## 2011-12-12 DIAGNOSIS — R760 Raised antibody titer: Secondary | ICD-10-CM

## 2011-12-12 LAB — PROTIME-INR

## 2011-12-12 NOTE — Progress Notes (Signed)
INR still below goal.  Pt states that she missed 1 dose.  The INR=4.87 sent from [facility] on 12/02/11 (the INR was dated 11/23/11 although pt was not at their clinic on this date) may not have been Ms Stringfield's lab result.  The RN from [facility] never called to confirm.  The new [facility] contact is Philis Nettle, RN at [phone].  I have increased Coumadin dose back to 6mg  daily, which pt was stable on this dose prior to 12/02/11.  Will f/u INR next week from [facility].

## 2011-12-21 ENCOUNTER — Telehealth: Payer: Self-pay | Admitting: Pharmacist

## 2011-12-21 NOTE — Telephone Encounter (Signed)
Loraine Maple, RN at [facility] and left a message to have INR results faxed to Pharmacy department. Her contact number is [phone].  CE

## 2011-12-26 ENCOUNTER — Telehealth: Payer: Self-pay | Admitting: Pharmacist

## 2011-12-27 ENCOUNTER — Telehealth: Payer: Self-pay | Admitting: Pharmacist

## 2011-12-29 ENCOUNTER — Telehealth: Payer: Self-pay | Admitting: Pharmacist

## 2012-01-11 ENCOUNTER — Ambulatory Visit (HOSPITAL_BASED_OUTPATIENT_CLINIC_OR_DEPARTMENT_OTHER): Payer: 59 | Admitting: Pharmacist

## 2012-01-11 DIAGNOSIS — Z7901 Long term (current) use of anticoagulants: Secondary | ICD-10-CM

## 2012-01-11 DIAGNOSIS — R760 Raised antibody titer: Secondary | ICD-10-CM

## 2012-01-11 NOTE — Patient Instructions (Signed)
INR above goal likely due to antibiotic usage Hold coumadin today and tomorrow Then resume normal regimen of 6 mg daily F/u at [facility] for INR in January Call if any issues arise

## 2012-01-11 NOTE — Progress Notes (Signed)
INR above goal today (4.4). This is likely due to antibiotic usage that she stopped yesterday (was on amoxicillin). Hold coumadin today and tomorrow then resume normal regimen of 6 mg daily and follow up with INR at [facility]. The new [facility] contact is Philis Nettle, RN at [phone].

## 2012-02-15 ENCOUNTER — Ambulatory Visit (HOSPITAL_BASED_OUTPATIENT_CLINIC_OR_DEPARTMENT_OTHER): Payer: 59 | Admitting: Pharmacist

## 2012-02-15 DIAGNOSIS — Z7901 Long term (current) use of anticoagulants: Secondary | ICD-10-CM

## 2012-02-15 DIAGNOSIS — R760 Raised antibody titer: Secondary | ICD-10-CM

## 2012-02-15 LAB — POCT INR: INR: 2.66

## 2012-02-15 NOTE — Progress Notes (Signed)
INR therapeutic today (2.66) on 6mg  daily. No changes in meds or diet.  No complaints.  Continue current dose.  Recheck INR in 3 weeks with next visit to [facility].

## 2012-03-07 ENCOUNTER — Telehealth: Payer: Self-pay | Admitting: Pharmacist

## 2012-03-14 ENCOUNTER — Ambulatory Visit (HOSPITAL_BASED_OUTPATIENT_CLINIC_OR_DEPARTMENT_OTHER): Payer: 59 | Admitting: Pharmacist

## 2012-03-14 DIAGNOSIS — Z7901 Long term (current) use of anticoagulants: Secondary | ICD-10-CM

## 2012-03-14 DIAGNOSIS — D6859 Other primary thrombophilia: Secondary | ICD-10-CM

## 2012-03-14 DIAGNOSIS — R760 Raised antibody titer: Secondary | ICD-10-CM

## 2012-03-14 LAB — POCT INR: INR: 2.08

## 2012-03-14 NOTE — Progress Notes (Signed)
Continue Coumadin  6mg  daily today. Recheck INR in 3 weeks at [facility] on 04/04/12.  Received Faxed INR results from Philis Nettle at [facility] Phone = [phone] Fax = [phone] 03/14/12 INR = 2.08

## 2012-03-14 NOTE — Patient Instructions (Signed)
Continue Coumadin  6mg  daily today. Recheck INR in 3 weeks at [facility] on 04/04/12.

## 2012-04-04 ENCOUNTER — Ambulatory Visit: Payer: Self-pay | Admitting: Pharmacist

## 2012-04-04 DIAGNOSIS — R760 Raised antibody titer: Secondary | ICD-10-CM

## 2012-04-04 DIAGNOSIS — Z7901 Long term (current) use of anticoagulants: Secondary | ICD-10-CM

## 2012-04-04 NOTE — Progress Notes (Signed)
Spoke to [name] on phone.  No changes to report.  Will continue Coumadin 6mg  daily and check PT/INR with next appt at [facility] on 05/02/12.

## 2012-04-05 ENCOUNTER — Ambulatory Visit (INDEPENDENT_AMBULATORY_CARE_PROVIDER_SITE_OTHER): Payer: 59 | Admitting: Women's Health

## 2012-04-05 ENCOUNTER — Encounter: Payer: Self-pay | Admitting: Women's Health

## 2012-04-05 ENCOUNTER — Telehealth: Payer: Self-pay | Admitting: Oncology

## 2012-04-05 VITALS — BP 118/74 | Ht 64.25 in | Wt 246.0 lb

## 2012-04-05 DIAGNOSIS — N898 Other specified noninflammatory disorders of vagina: Secondary | ICD-10-CM

## 2012-04-05 DIAGNOSIS — Z01419 Encounter for gynecological examination (general) (routine) without abnormal findings: Secondary | ICD-10-CM

## 2012-04-05 LAB — WET PREP FOR TRICH, YEAST, CLUE: Trich, Wet Prep: NONE SEEN

## 2012-04-05 MED ORDER — CLOBETASOL PROP EMOLLIENT BASE 0.05 % EX CREA: TOPICAL_CREAM | CUTANEOUS | Status: DC

## 2012-04-05 NOTE — Progress Notes (Addendum)
NADELYN ENRIQUES 04-Jan-1976 [id number]    History:    The patient presents for annual exam.  Monthly 5-6 day cycle/withdrawal. Numerous health problems, lupus diagnosed in 07, ANA, fibromyalgia, asthma. Hospitalized 08/2011 for pleurisy. History of DVT in pregnancy while on Lovenox. Complained of external vaginal itching. 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   Past medical history, past surgical history, family history and social history were all reviewed and documented in the EPIC chart. 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   ROS:  A  ROS was performed and pertinent positives and negatives are included in the history.  Exam:  Filed Vitals:   04/05/12 0911  BP: 118/74    General appearance:  Normal Head/Neck:  Normal, without cervical or supraclavicular adenopathy. Thyroid:  Symmetrical, normal in size, without palpable masses or nodularity. Respiratory  Effort:  Normal  Auscultation:  Clear without wheezing or rhonchi Cardiovascular  Auscultation:  Regular rate, without rubs, murmurs or gallops  Edema/varicosities:  Not grossly evident Abdominal  Soft,nontender, without masses, guarding or rebound.  Liver/spleen:  No organomegaly noted  Hernia:  None appreciated  Skin  Inspection:  Grossly normal  Palpation:  Grossly normal Neurologic/psychiatric  Orientation:  Normal with appropriate conversation.  Mood/affect:  Normal  Genitourinary    Breasts: Examined lying and sitting.     Right: Without masses, retractions, discharge or axillary adenopathy.     Left: Without masses, retractions, discharge or axillary adenopathy.   Inguinal/mons:  Normal without inguinal adenopathy  External genitalia:  Erythematous with areas of dryness.  BUS/Urethra/Skene's glands:  Normal  Bladder:  Normal  Vagina:  Normal wet prep negative  Cervix:  Normal  Uterus:   normal in size, shape and contour.  Midline and mobile  Adnexa/parametria:     Rt: Without masses or  tenderness.   Lt: Without masses or tenderness.  Anus and perineum: Normal  Digital rectal exam: Normal sphincter tone without palpated masses or tenderness  Assessment/Plan:  37 y.o. M. WF G2 P1 for annual exam.     Regular monthly cycles/withdrawal Lupus/ANA/fibromyalgia/asthma-rheumatologist and primary care labs and meds Obesity  Plan: Contraception management reviewed, reviewed vasectomy may be best option. SBE's, increase regular exercise is able, calcium rich diet, decrease calories for weight loss, vitamin D 1000 daily encouraged. Wet prep negative, will use Temovate ointment externally twice daily if needed, instructed to call if no relief of irritation. Pap normal 03/2010, new screening guidelines reviewedHarrington Challenger [facility], 9:48 AM 04/05/2012

## 2012-04-05 NOTE — Patient Instructions (Addendum)

## 2012-04-05 NOTE — Telephone Encounter (Signed)
spoke w/ pt gv appts d/t...td °

## 2012-04-06 ENCOUNTER — Encounter: Payer: Self-pay | Admitting: Gynecology

## 2012-04-09 ENCOUNTER — Telehealth: Payer: Self-pay | Admitting: Oncology

## 2012-04-09 NOTE — Telephone Encounter (Signed)
left message gv appt d/t. made pt aware that i will mail letter/cal...td

## 2012-05-02 ENCOUNTER — Ambulatory Visit (HOSPITAL_BASED_OUTPATIENT_CLINIC_OR_DEPARTMENT_OTHER): Payer: 59 | Admitting: Pharmacist

## 2012-05-02 DIAGNOSIS — D6859 Other primary thrombophilia: Secondary | ICD-10-CM

## 2012-05-02 DIAGNOSIS — Z7901 Long term (current) use of anticoagulants: Secondary | ICD-10-CM

## 2012-05-02 DIAGNOSIS — R760 Raised antibody titer: Secondary | ICD-10-CM

## 2012-05-02 NOTE — Patient Instructions (Addendum)
Continue Coumadin 6mg  daily. Recheck INR in 4 weeks at [facility] on 05/30/12.

## 2012-05-02 NOTE — Progress Notes (Signed)
INR slightly below goal. Pt missed 2 doses of coumadin in the last week. She forgot to pick up her prescription refill from the pharmacy. No s/s of clotting noted. No other problems to report regarding anticoagulation. Continue Coumadin 6mg  daily.  Recheck INR in 4 weeks at [facility] on 05/30/12.

## 2012-05-30 ENCOUNTER — Ambulatory Visit (HOSPITAL_BASED_OUTPATIENT_CLINIC_OR_DEPARTMENT_OTHER): Payer: 59 | Admitting: Pharmacist

## 2012-05-30 DIAGNOSIS — R760 Raised antibody titer: Secondary | ICD-10-CM

## 2012-05-30 DIAGNOSIS — R894 Abnormal immunological findings in specimens from other organs, systems and tissues: Secondary | ICD-10-CM

## 2012-05-30 DIAGNOSIS — Z7901 Long term (current) use of anticoagulants: Secondary | ICD-10-CM

## 2012-05-30 LAB — POCT INR: INR: 2.29

## 2012-05-30 NOTE — Progress Notes (Signed)
INR = 2.29 (drawn at [facility] today).  Coumadin dose is 6 mg daily. Pt not available to discuss results with by phone.  I emailed her ([email or web address]) & instructed her to call if significant med/diet changes otherwise she will continue her current dose & call or email back w/ next planned lab appt at [facility] so we know when to expect another INR. This encounter was not face-to-face; NO CHARGE. [name], Pharm.D., CPP 05/30/2012@4 :08 PM

## 2012-06-01 ENCOUNTER — Encounter: Payer: Self-pay | Admitting: Pharmacist

## 2012-06-01 NOTE — Progress Notes (Signed)
Pt returned email to let me know her next appt at [facility] is 06/27/12. [name], Pharm.D., CPP 06/01/2012@5 :05 PM

## 2012-06-27 ENCOUNTER — Telehealth: Payer: Self-pay | Admitting: Pharmacist

## 2012-06-28 NOTE — Telephone Encounter (Signed)
  Received phone call from Philis Nettle at [facility]  Phone = [phone]  Fax = [phone]  Pt has changed her appt. Pt next appt at [facility] has been moved to 07/03/12 from 06/27/12.

## 2012-07-03 ENCOUNTER — Ambulatory Visit (HOSPITAL_BASED_OUTPATIENT_CLINIC_OR_DEPARTMENT_OTHER): Payer: 59 | Admitting: Pharmacist

## 2012-07-03 DIAGNOSIS — Z7901 Long term (current) use of anticoagulants: Secondary | ICD-10-CM

## 2012-07-03 DIAGNOSIS — R760 Raised antibody titer: Secondary | ICD-10-CM

## 2012-07-03 DIAGNOSIS — R894 Abnormal immunological findings in specimens from other organs, systems and tissues: Secondary | ICD-10-CM

## 2012-07-03 LAB — POCT INR: INR: 2.25

## 2012-07-06 ENCOUNTER — Ambulatory Visit: Payer: 59 | Admitting: Oncology

## 2012-07-06 ENCOUNTER — Other Ambulatory Visit: Payer: 59 | Admitting: Lab

## 2012-07-06 NOTE — Patient Instructions (Signed)
Continue Coumadin 6mg  daily.  Recheck INR in 4 weeks on 07/30/12: Lab at 10:30am, Dr. Cyndie Chime at 11am and coumadin clinic at 11:30am.

## 2012-07-06 NOTE — Progress Notes (Signed)
Left pt a message to call us back with any changes to her medications or if she has any problems, questions or concerns. WIll continue Coumadin 6mg  daily.  Recheck INR in 4 weeks on 07/30/12: Lab at 10:30am, Dr. Cyndie Chime at 11am and coumadin clinic at 11:30am. This is a telephone encounter.

## 2012-07-10 ENCOUNTER — Other Ambulatory Visit: Payer: Self-pay | Admitting: Internal Medicine

## 2012-07-10 ENCOUNTER — Telehealth: Payer: Self-pay | Admitting: Pharmacist

## 2012-07-10 DIAGNOSIS — Z1231 Encounter for screening mammogram for malignant neoplasm of breast: Secondary | ICD-10-CM

## 2012-07-10 NOTE — Telephone Encounter (Signed)
[name] returning call from 07/03/12, which we received INR results from [facility] and INR = 2.25.  Goal INR = 2-3.  [name] has no medication changes or issues with her warfarin therapy.  She was unaware of scheduled appt with Dr. Cyndie Chime on 07/30/12 and not sure if that day works for her.  She does have appt at [facility] on 07/25/12, so will f/u INR from that appt.

## 2012-07-25 ENCOUNTER — Ambulatory Visit: Payer: Self-pay | Admitting: Pharmacist

## 2012-07-25 DIAGNOSIS — Z7901 Long term (current) use of anticoagulants: Secondary | ICD-10-CM

## 2012-07-25 DIAGNOSIS — R760 Raised antibody titer: Secondary | ICD-10-CM

## 2012-07-25 LAB — POCT INR: INR: 1.27

## 2012-07-25 NOTE — Progress Notes (Signed)
INR below goal today. Missed 3-4 doses in the last 7 days because she ran out of her medication and forgot to pick it up. She has picked up a refill on 6/16 and has resumed taking her coumadin. Pt is not taking Vitamin D supplement.  Bupropion 150mg  po BID was added to medication list. No changes in diet. No problems to report regarding anticoagulation. No s/s of clotting. Continue Coumadin 6mg  daily. Recheck INR in ~ 10 days. Pt unable to come for lab/MD/Coumadin clinic on 6/23. She plans to reschedule that appt. Will f/u with patient later this week to see if we can combine rescheduled MD apt together with next INR check in ~ 10days. Spoke with patient by telephone - no charge encounter.

## 2012-07-25 NOTE — Patient Instructions (Addendum)
Continue Coumadin 6mg  daily. Recheck INR in ~ 10 days.

## 2012-07-26 ENCOUNTER — Telehealth: Payer: Self-pay | Admitting: Pharmacist

## 2012-07-26 ENCOUNTER — Telehealth: Payer: Self-pay | Admitting: Oncology

## 2012-07-26 NOTE — Telephone Encounter (Signed)
Spoke with pt to r/s [facility] visit for 08/02/12 at 12:30.

## 2012-07-26 NOTE — Telephone Encounter (Signed)
Pt called and r/s lab and MD from 6/23 to August 4th , lab and MD , nurse notified

## 2012-07-30 ENCOUNTER — Ambulatory Visit: Payer: 59

## 2012-07-30 ENCOUNTER — Other Ambulatory Visit: Payer: 59 | Admitting: Lab

## 2012-07-30 ENCOUNTER — Ambulatory Visit: Payer: 59 | Admitting: Oncology

## 2012-08-02 ENCOUNTER — Ambulatory Visit: Payer: 59 | Admitting: Pharmacist

## 2012-08-02 ENCOUNTER — Ambulatory Visit (HOSPITAL_BASED_OUTPATIENT_CLINIC_OR_DEPARTMENT_OTHER): Payer: 59 | Admitting: Lab

## 2012-08-02 DIAGNOSIS — Z7901 Long term (current) use of anticoagulants: Secondary | ICD-10-CM

## 2012-08-02 DIAGNOSIS — R760 Raised antibody titer: Secondary | ICD-10-CM

## 2012-08-02 LAB — PROTIME-INR: Protime: 49.2 Seconds — ABNORMAL HIGH (ref 10.6–13.4)

## 2012-08-02 NOTE — Progress Notes (Signed)
INR = 4.1 on Coumadin 6 mg daily Pt took 8 mg on Mon & Tues this week by accident. No bleeding. INR supratherapeutic due to dose deviation by pt. Hold x 1 dose today then resume 6 mg daily. Pt goes back to [facility] on 08/22/12 for lab work. Will call pt on 08/22/12 or when we receive INR result from [facility]. [name], Pharm.D., CPP 08/02/2012@1 :07 PM

## 2012-08-08 ENCOUNTER — Ambulatory Visit
Admission: RE | Admit: 2012-08-08 | Discharge: 2012-08-08 | Disposition: A | Discharge status: Home / Self Care | Payer: Self-pay | Source: Ambulatory Visit | Attending: Internal Medicine | Admitting: Internal Medicine

## 2012-08-08 DIAGNOSIS — Z1231 Encounter for screening mammogram for malignant neoplasm of breast: Secondary | ICD-10-CM

## 2012-08-22 ENCOUNTER — Encounter: Payer: Self-pay | Admitting: Pharmacist

## 2012-08-22 NOTE — Progress Notes (Signed)
Received PT/INR results from [facility] from 08/21/12.  INR=4.33.  I have tried to call cell # on file and voice mailbox is full.  Work # no one answered.  Will try to call back tomorrow.

## 2012-08-23 ENCOUNTER — Telehealth: Payer: Self-pay | Admitting: Pharmacist

## 2012-08-23 ENCOUNTER — Ambulatory Visit: Payer: Self-pay | Admitting: Pharmacist

## 2012-08-23 DIAGNOSIS — R760 Raised antibody titer: Secondary | ICD-10-CM

## 2012-08-23 DIAGNOSIS — Z7901 Long term (current) use of anticoagulants: Secondary | ICD-10-CM

## 2012-08-23 NOTE — Telephone Encounter (Signed)
Was able to reach patient today to discuss her INR results. Her INR was elevated at 4.33. Pt reports no extra doses or medication changes. She reports no bruising or bleeding except when her "IV was pulled out after drawing blood" I instructed the patient to hold her coumadin tonight and to decrease dose to 6 mg daily except for 4 mg on Mondays and Fridays. She will come for lab and coumadin clinic next week on Friday 08/31/12 and noon for lab and 12:15 for coumadin clinic

## 2012-08-23 NOTE — Progress Notes (Signed)
Was able to reach patient today to discuss her INR results. Her INR was elevated at 4.33. Pt reports no extra doses or medication changes. She reports no bruising or bleeding except when her "IV was pulled out after drawing blood" I instructed the patient to hold her coumadin tonight and to decrease dose to 6 mg daily except for 4 mg on Mondays and Fridays. She will come for lab and coumadin clinic next week on Friday 08/31/12 and noon for lab and 12:15 for coumadin clinic  Phone encounter = NO CHARGE

## 2012-08-23 NOTE — Patient Instructions (Signed)
INR above goal  Hold coumadin x 1 day  Then decrease dose to 6 mg daily except for 4 mg on Mon and Fri  Return next week 08/31/12 at 12 noon for lab and 12:15 for coumadin clinic

## 2012-08-31 ENCOUNTER — Telehealth: Payer: Self-pay | Admitting: Pharmacist

## 2012-08-31 ENCOUNTER — Other Ambulatory Visit: Payer: Self-pay

## 2012-08-31 ENCOUNTER — Ambulatory Visit: Payer: Self-pay

## 2012-08-31 NOTE — Telephone Encounter (Signed)
Attempted to call pt to r/s lab/CC apt from 08/31/12. Pt voicemail is full and not accepting messages at this time. Will try again next week.

## 2012-09-03 ENCOUNTER — Telehealth: Payer: Self-pay | Admitting: Pharmacist

## 2012-09-03 NOTE — Telephone Encounter (Signed)
Called and reached patient to R/S coumadin clinic visit to [facility] 09/05/12 at 12:15pm

## 2012-09-05 ENCOUNTER — Other Ambulatory Visit: Payer: Self-pay | Admitting: Lab

## 2012-09-05 ENCOUNTER — Ambulatory Visit: Payer: Self-pay

## 2012-09-10 ENCOUNTER — Ambulatory Visit (HOSPITAL_BASED_OUTPATIENT_CLINIC_OR_DEPARTMENT_OTHER): Payer: BC Managed Care – PPO | Admitting: Pharmacist

## 2012-09-10 ENCOUNTER — Ambulatory Visit (HOSPITAL_BASED_OUTPATIENT_CLINIC_OR_DEPARTMENT_OTHER): Payer: BC Managed Care – PPO | Admitting: Oncology

## 2012-09-10 ENCOUNTER — Other Ambulatory Visit (HOSPITAL_BASED_OUTPATIENT_CLINIC_OR_DEPARTMENT_OTHER): Payer: BC Managed Care – PPO

## 2012-09-10 ENCOUNTER — Encounter: Payer: Self-pay | Admitting: Oncology

## 2012-09-10 VITALS — BP 102/60 | HR 81 | Temp 97.5°F | Resp 18 | Ht 64.0 in | Wt 248.2 lb

## 2012-09-10 DIAGNOSIS — D6859 Other primary thrombophilia: Secondary | ICD-10-CM

## 2012-09-10 DIAGNOSIS — M329 Systemic lupus erythematosus, unspecified: Secondary | ICD-10-CM

## 2012-09-10 DIAGNOSIS — Z86718 Personal history of other venous thrombosis and embolism: Secondary | ICD-10-CM

## 2012-09-10 DIAGNOSIS — Z7901 Long term (current) use of anticoagulants: Secondary | ICD-10-CM

## 2012-09-10 DIAGNOSIS — R760 Raised antibody titer: Secondary | ICD-10-CM

## 2012-09-10 DIAGNOSIS — R894 Abnormal immunological findings in specimens from other organs, systems and tissues: Secondary | ICD-10-CM

## 2012-09-10 DIAGNOSIS — R51 Headache: Secondary | ICD-10-CM

## 2012-09-10 LAB — PROTIME-INR: Protime: 34.8 Seconds — ABNORMAL HIGH (ref 10.6–13.4)

## 2012-09-10 LAB — POCT INR: INR: 2.9

## 2012-09-10 NOTE — Patient Instructions (Addendum)
Continue Coumadin to 6mg  daily except for 4 mg on Mondays and Fridays. F/U INR on 09/18/12 at [facility]

## 2012-09-10 NOTE — Progress Notes (Signed)
Patient advised that she would fax her insurance information to me and that I gave her the faxed no# didn't have her card at time of service.

## 2012-09-10 NOTE — Progress Notes (Signed)
Pt seen in clinic today after MD appmt Pt INR=2.9 She states she had been taking 6mg  daily and forgot to change the two days to 4mg .   Since she skipped a dose per instructions and missed a dose inadvertantly, so essentially decreased her weekly dose appropriately Will continue Coumadin to 6mg  daily except for 4 mg on Mondays and Fridays.  No changes to report.  Diet is consistent and no new meds. She has an appmt at [facility] next week, so she will have them draw her INR and contact us with the results. She states they have a standing order to draw as needed over at [facility]. F/U INR on 09/18/12 at [facility]

## 2012-09-10 NOTE — Progress Notes (Signed)
Hematology and Oncology Follow Up Visit  [name] [id number] [birth year] 37 y.o. 09/10/2012 1:03 PM   Principle Diagnosis: Encounter Diagnoses  Name Primary?  . Systemic lupus erythematosus Yes  . Headache   . Chronic anticoagulation   . Anticardiolipin antibody positive      Interim History:   Followup visit for this pleasant 37 year old woman with lupus. She had a left lower extremity DVT in 08/2004. She was found to have a positive lupus type anticoagulant. She had elevated anticardiolipin antibodies against IgG in the past, and low positive against IgM as well as IgM antibodies against beta 2 glycoprotein 1. She has a high positive ANA. She has been maintained on full dose therapeutic Coumadin, now since her DVT, which occurred at the time of her second pregnancy in 2006. Of note, she had a previous elective termination of her first pregnancy.  Her lupus is currently under good control. She remains on Plaquenil. She has been on an previously experimental injectable drug, which she receives by one-hour IV infusion monthly at [facility]. The drug has now been FDA approved. She does believe it is helping her. She has been able to taper her prednisone down to just 2 mg a day.  Main lupus symptoms are large joint pains and  fatigue.  She's had no interim medical problems. She did get hit in the back with a baseball a few days ago and is now having some headaches. Otherwise no recent change in vision or chronic headaches. She denies any dyspnea, chest pain, palpitations, leg pain or swelling.   Medications: reviewed  Allergies: No Known Allergies  Review of Systems: Constitutional:   Chronic fatigue Respiratory: No cough or dyspnea Cardiovascular:  See above Gastrointestinal: No change in bowel habit Genito-Urinary: Menstrual cycles remain regular Musculoskeletal: See above Neurologic: See above Skin: Large bruise where she got struck right upper back with a  baseball Remaining ROS negative.  Physical Exam: Blood pressure 102/60, pulse 81, temperature 97.5 F (36.4 C), temperature source Oral, resp. rate 18, height 5\' 4"  (1.626 m), weight 248 lb 3.2 oz (112.583 kg). Wt Readings from Last 3 Encounters:  09/10/12 248 lb 3.2 oz (112.583 kg)  04/05/12 246 lb (111.585 kg)  07/01/11 247 lb 8 oz (112.265 kg)     General appearance: Overweight Caucasian woman HENNT: Pharynx no erythema or exudate Lymph nodes: No lymphadenopathy Breasts: Lungs: Clear to auscultation resonant to percussion Heart: Regular rhythm no murmur Abdomen: Soft, nontender, no mass, no organomegaly Extremities: No edema, no calf tenderness. Left 50 cm right 49 Musculoskeletal: No joint deformities GU: Vascular: No cyanosis Neurologic: Mental status intact, PERRLA, optic disc sharp and vessels normal, motor strength 5 over 5, reflexes 1+ symmetric Skin: Large ecchymosis which she got struck right upper back by a baseball  Lab Results: Lab Results  Component Value Date   WBC 10.5 08/20/2011   HGB 12.4 08/20/2011   HCT 37.2 08/20/2011   MCV 88.2 08/20/2011   PLT 373 08/20/2011     Chemistry      Component Value Date/Time   NA 139 08/20/2011 1759   K 3.6 08/20/2011 1759   CL 103 08/20/2011 1759   CO2 26 08/20/2011 1759   BUN 7 08/20/2011 1759   CREATININE 0.64 08/20/2011 1759      Component Value Date/Time   CALCIUM 9.0 08/20/2011 1759   ALKPHOS 52 07/08/2011 0804   AST 11 07/08/2011 0804   ALT 11 07/08/2011 0804   BILITOT 0.4 07/08/2011 0804  Impression: #1. Hypercoagulable state secondary to antiphospholipid antibody syndrome   #2. Status post left lower  DVT during pregnancy related to #1.   #3. Systemic lupus    CC:.    Levert Feinstein, MD 8/4/20141:03 PM

## 2012-09-11 ENCOUNTER — Telehealth: Payer: Self-pay | Admitting: Oncology

## 2012-09-11 NOTE — Telephone Encounter (Signed)
s.w. pt and advised on aug 2015 appt...pt ok and aware

## 2012-09-19 ENCOUNTER — Ambulatory Visit: Payer: Self-pay | Admitting: Pharmacist

## 2012-09-19 DIAGNOSIS — Z7901 Long term (current) use of anticoagulants: Secondary | ICD-10-CM

## 2012-09-19 DIAGNOSIS — R760 Raised antibody titer: Secondary | ICD-10-CM

## 2012-09-19 LAB — POCT INR: INR: 1.76

## 2012-09-19 NOTE — Progress Notes (Signed)
Spoke to pt on telephone.  No charge encounter.  INR on the low side at 1.8 on coumadin 6mg  daily 4mg  m/f.  No changes in medications.  No other medical changes to report.  Will slightly increase coumadin to 6mg  daily but 4mg  on Mon.  Will f/u INR with [name]'s next appt at [facility] on 10/17/12.

## 2012-10-11 ENCOUNTER — Other Ambulatory Visit: Payer: Self-pay | Admitting: Pharmacist

## 2012-10-11 DIAGNOSIS — Z7901 Long term (current) use of anticoagulants: Secondary | ICD-10-CM

## 2012-10-11 MED ORDER — WARFARIN SODIUM 4 MG PO TABS: 6.0000 mg | ORAL_TABLET | Freq: Every day | ORAL | Status: DC

## 2012-10-25 ENCOUNTER — Telehealth: Payer: Self-pay | Admitting: Pharmacist

## 2012-10-25 NOTE — Telephone Encounter (Signed)
We were expecting INR result from [facility] ~10/17/12 but never recv'd result by fax. Care Everywhere shows INR result from July 2014 so appears pt did not go to [place] on 10/17/12. I called pt today & left vm for her to call pharmacy & let us know when she is going back to [facility] so we'll know when to expect her next INR.  If it is for a while off, we may need her to come here for INR check. [name], Pharm.D., CPP 10/25/2012@10 :43 AM

## 2012-11-15 ENCOUNTER — Ambulatory Visit (INDEPENDENT_AMBULATORY_CARE_PROVIDER_SITE_OTHER): Payer: BC Managed Care – PPO | Admitting: Pharmacist

## 2012-11-15 DIAGNOSIS — Z7901 Long term (current) use of anticoagulants: Secondary | ICD-10-CM

## 2012-11-15 NOTE — Patient Instructions (Signed)
Patient will take coumadin 6mg  daily and 4mg  on Thur and Mon She returns to [place] on Nov 5.

## 2012-11-15 NOTE — Progress Notes (Signed)
Spoke with patient over phone [facility] faxed INR results from 11/13/12 INR=3.43 Pt states there are 4mg  days she forgets to take 4 and takes 6mg  instead. This is consistent with a higher INR She will take 4mg  on Thur and Mon and 6mg  other days. Her f/u appmt with [facility] is Nov 5. She will have an INR done then and have results faxed to [place].

## 2012-12-13 ENCOUNTER — Encounter: Payer: Self-pay | Admitting: Pharmacist

## 2012-12-13 NOTE — Progress Notes (Signed)
Left VM for Merilynn to return call to coumadin clinic at [phone].  Received PT/INR = 15.5/1.55 from [facility].

## 2012-12-14 ENCOUNTER — Ambulatory Visit: Payer: BC Managed Care – PPO | Admitting: Pharmacist

## 2012-12-14 DIAGNOSIS — R760 Raised antibody titer: Secondary | ICD-10-CM

## 2012-12-14 DIAGNOSIS — Z7901 Long term (current) use of anticoagulants: Secondary | ICD-10-CM

## 2012-12-14 NOTE — Progress Notes (Signed)
No Charge - Telephone Encounter**  Received INR Fax yesterday afternoon (12/13/12) from [facility]. INR is below goal at 1.55. Pt was unable to be reached yesterday but was reached by phone this afternoon Pt is doing well with no complaints besides her schedule being very busy She reports missing 2 doses last week but she cannot remember which days she missed She reports no bleeding or bruising and no diet changes or new medications It is important to try and get her INR back to goal quickly. Will boost her coumadin regimen and recheck INR early next week at the cancer center Plan: [name] will take 8 mg of coumadin tonight x 1 then Increase Coumadin to 6mg  daily   F/U INR at the cancer center on 12/18/12 at 12pm for lab and 12:15pm for coumadin clinic

## 2012-12-14 NOTE — Patient Instructions (Addendum)
Take 8 mg tonight x 1 then Increase Coumadin to 6mg  daily   F/U INR at the cancer center on 12/18/12 at 12pm for lab and 12:15pm for coumadin clinic

## 2012-12-18 ENCOUNTER — Ambulatory Visit: Payer: BC Managed Care – PPO

## 2012-12-18 ENCOUNTER — Telehealth: Payer: Self-pay | Admitting: Pharmacist

## 2012-12-18 ENCOUNTER — Other Ambulatory Visit: Payer: BC Managed Care – PPO

## 2012-12-18 NOTE — Telephone Encounter (Signed)
[facility] today for lab and coumadin clinic. Called and left VM for patient to call back to reschedule  Christell Faith, PharmD

## 2013-01-17 ENCOUNTER — Ambulatory Visit: Payer: BC Managed Care – PPO | Admitting: Pharmacist

## 2013-01-17 NOTE — Progress Notes (Signed)
INR = 1.24    Goal 2-3 Fax received from [facility] with PT/INR results. INR subtherapeutic. Called patient and spoke with her. She missed only 1 dose of Coumadin since her last visit.  Missed dose 01/15/13. Patient was on amoxicillin from 11/28-12/3, no current abx. Patient did not keep last appt with Coumadin Clinic. Told patient that since her INR is low again she needs to be seen.  She requests to be seen on Tuesday 12/16 around 1pm. Patient will take 8 mg today and tomorrow, then resume 6 mg daily. She has appts for lab 12/16 at 12:45 and Coumadin Clinic at 1:00.  Cletis Athens, PharmD  NO CHARGE - telephone encounter

## 2013-01-22 ENCOUNTER — Ambulatory Visit: Payer: BC Managed Care – PPO

## 2013-01-22 ENCOUNTER — Telehealth: Payer: Self-pay | Admitting: Pharmacist

## 2013-01-22 ENCOUNTER — Other Ambulatory Visit: Payer: BC Managed Care – PPO

## 2013-01-23 ENCOUNTER — Ambulatory Visit (HOSPITAL_BASED_OUTPATIENT_CLINIC_OR_DEPARTMENT_OTHER): Payer: BC Managed Care – PPO | Admitting: Pharmacist

## 2013-01-23 ENCOUNTER — Other Ambulatory Visit: Payer: BC Managed Care – PPO

## 2013-01-23 DIAGNOSIS — Z7901 Long term (current) use of anticoagulants: Secondary | ICD-10-CM

## 2013-01-23 DIAGNOSIS — R760 Raised antibody titer: Secondary | ICD-10-CM

## 2013-01-23 DIAGNOSIS — D6859 Other primary thrombophilia: Secondary | ICD-10-CM

## 2013-01-23 LAB — PROTIME-INR

## 2013-01-23 LAB — POCT INR: INR: 2.9

## 2013-01-23 NOTE — Progress Notes (Signed)
INR within goal today. No problems to report regarding anticoagulation. No s/s of clotting. No changes in diet or medications. She was on vacation earlier this month and may have consumed less salads. No missed doses in the last week. Will continue Coumadin to 6mg  daily.  Recheck INR at [facility] on 02/05/13. Pt states her nurse at [facility] will be changing to someone else around the beginning of 2015.

## 2013-01-23 NOTE — Patient Instructions (Signed)
Continue Coumadin to 6mg  daily.  Recheck INR at [facility] on 02/05/13.

## 2013-02-05 ENCOUNTER — Ambulatory Visit: Payer: BC Managed Care – PPO | Admitting: Pharmacist

## 2013-02-05 DIAGNOSIS — Z7901 Long term (current) use of anticoagulants: Secondary | ICD-10-CM

## 2013-02-05 DIAGNOSIS — R760 Raised antibody titer: Secondary | ICD-10-CM

## 2013-02-05 LAB — POCT INR: INR: 3

## 2013-02-05 NOTE — Progress Notes (Signed)
INR = 3 today at [facility]. Pt is taking Coumadin 6 mg daily. No complaints re: anticoag.   Pt is not on abx currently. She completes her research study drug on 02/26/13; this will be her final "research" visit at [facility].  The plan is now for her to continue on tx but to receive commercial drug.  As long as her insurance pays for the commercial drug, we'll plan going forward to continue corresponding w/ lab at [facility] by faxes.  We'll need to get the contact person there when this transitions & [name] is aware we'll need a name/number. We'll call her on 02/26/13 w/ protime results but for now she'll stay on same Coumadin dose. NO CHARGE- phone encounter. [name], Pharm.D., CPP 02/05/2013@1 :43 PM

## 2013-02-22 IMAGING — CT CT ANGIO CHEST
1 of 2 series · 19 of 32 positions shown · IV contrast (OMNIPAQUE 300)
Comparison: 08/20/2011 radiograph, 05/23/2005 CT

CLINICAL DATA: Chest pain

CT ANGIOGRAPHY CHEST
TECHNIQUE: Multidetector CT imaging of the chest using the
standard protocol during bolus administration of intravenous
contrast. Multiplanar reconstructed images including MIPs were
obtained and reviewed to evaluate the vascular anatomy.
Contrast: 100mL OMNIPAQUE IOHEXOL 350 MG/ML SOLN

[Series 10: thins for pacs · axial · 0.64mm/px · z∈[+1734,+1940]mm · 19 of 230 slices shown]
[im 12/230  lung]
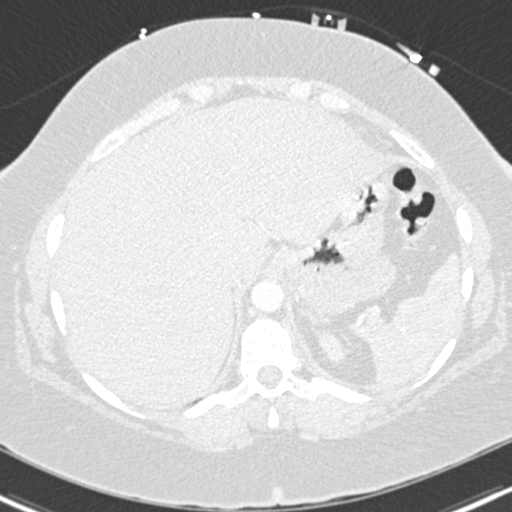
[im 23/230  mediastinal]
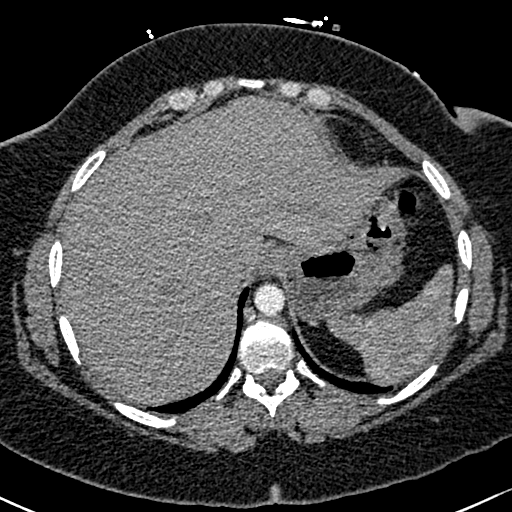
[im 35/230  lung]
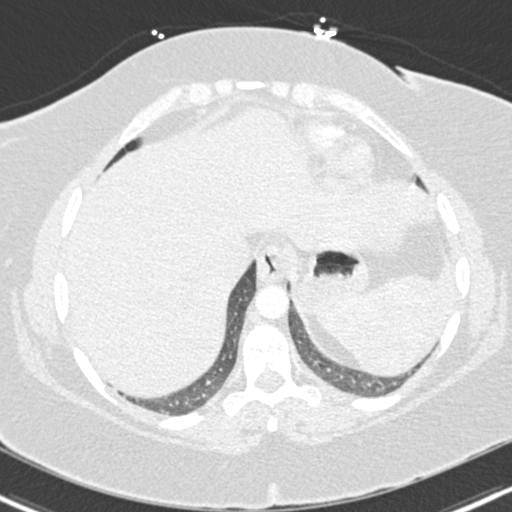
[im 58/230  mediastinal]
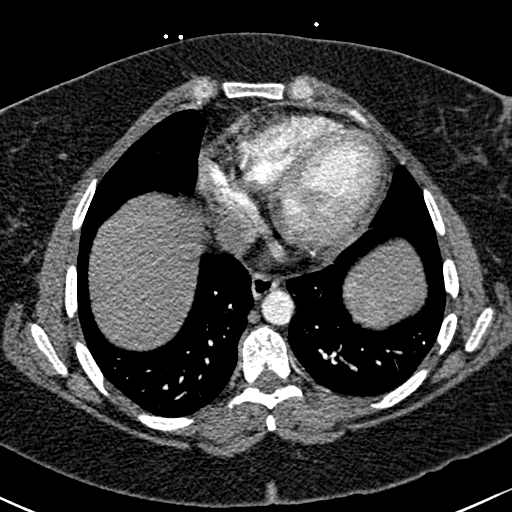
[im 69/230  lung]
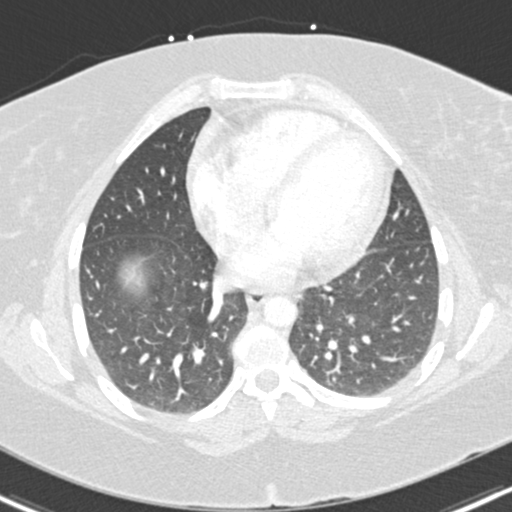
[im 77/230  mediastinal]
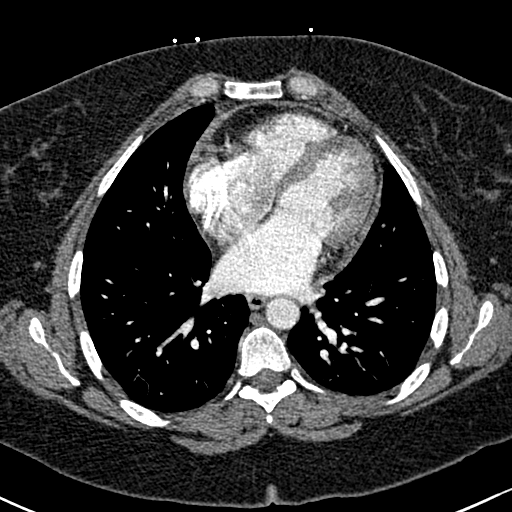
[im 81/230  lung]
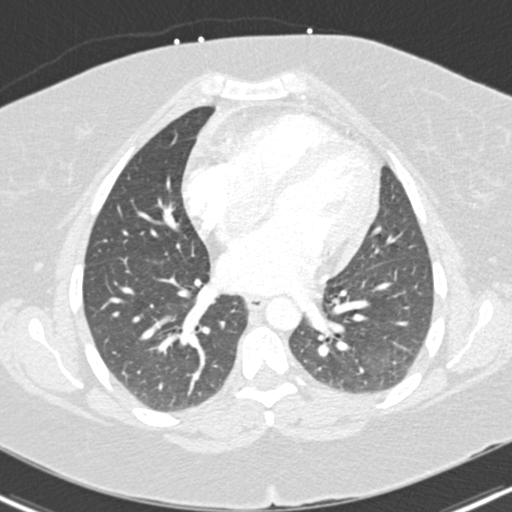
[im 92/230  mediastinal]
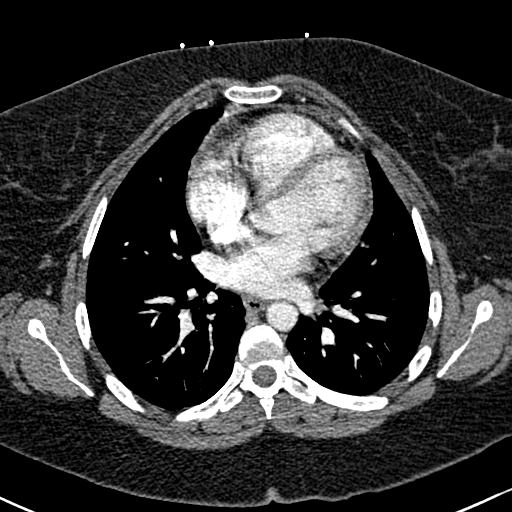
[im 104/230  lung]
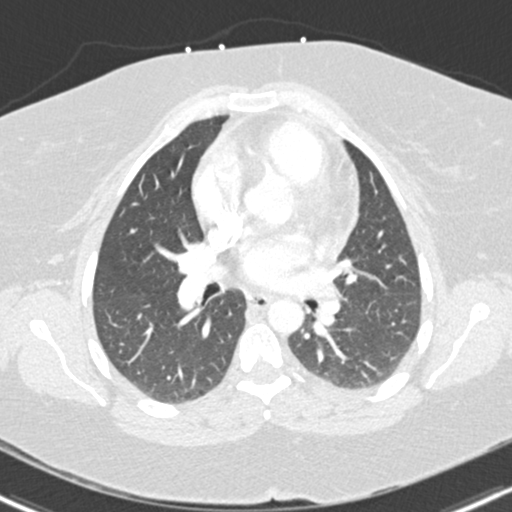
[im 115/230  mediastinal]
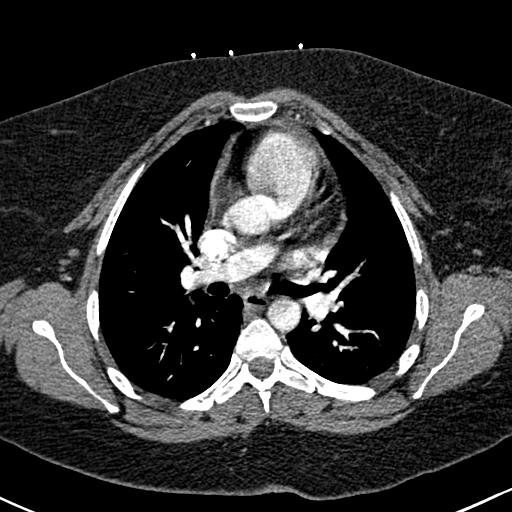
[im 126/230  lung]
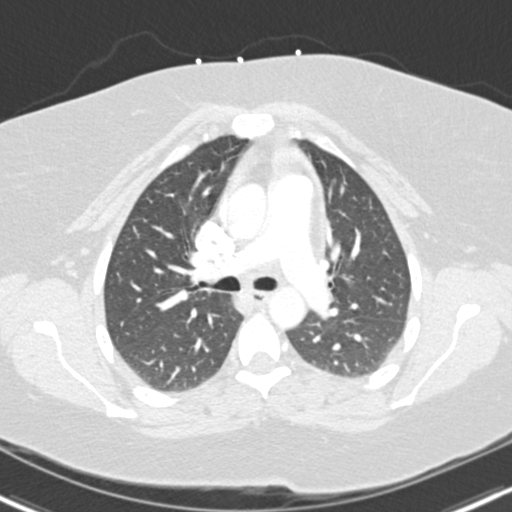
[im 138/230  mediastinal]
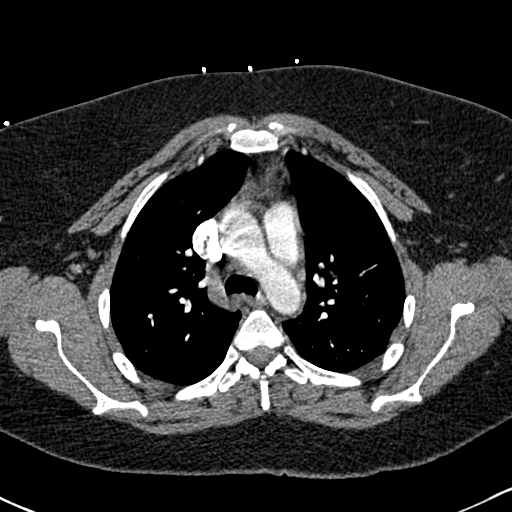
[im 149/230  lung]
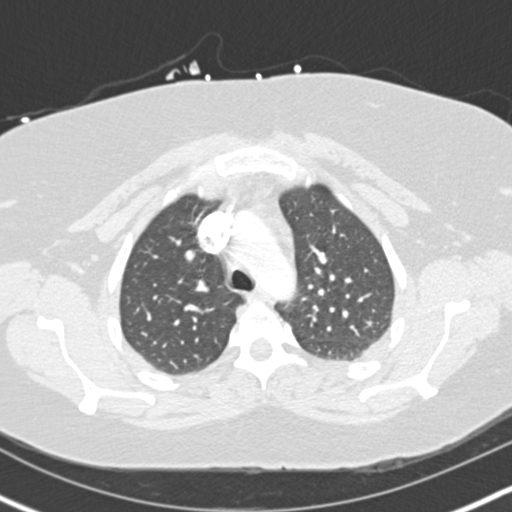
[im 153/230  mediastinal]
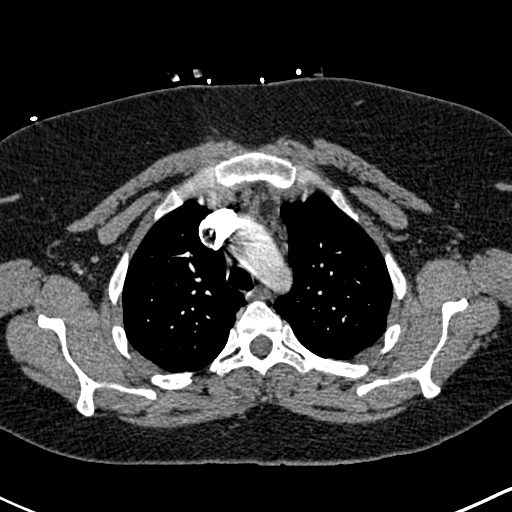
[im 161/230  lung]
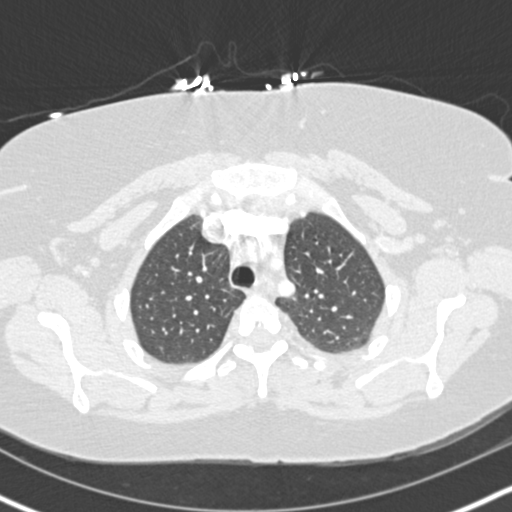
[im 172/230  mediastinal]
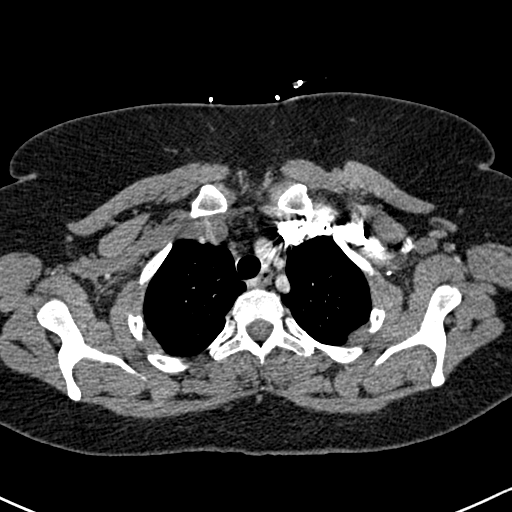
[im 195/230  lung]
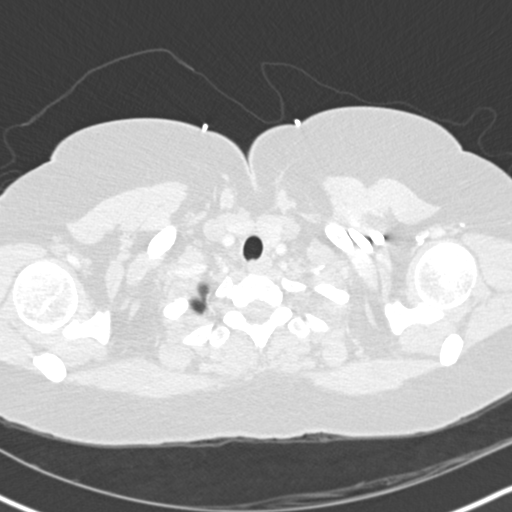
[im 207/230  mediastinal]
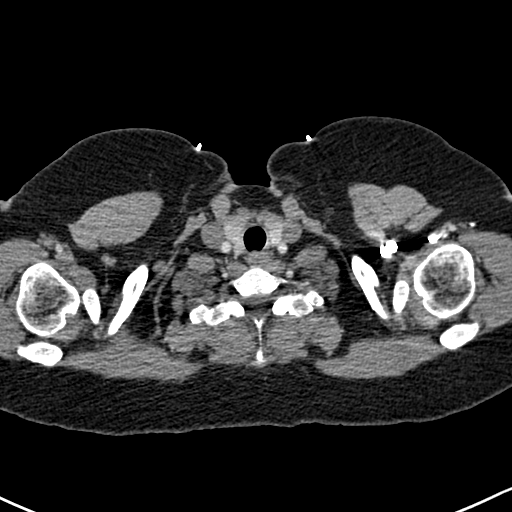
[im 218/230  lung]
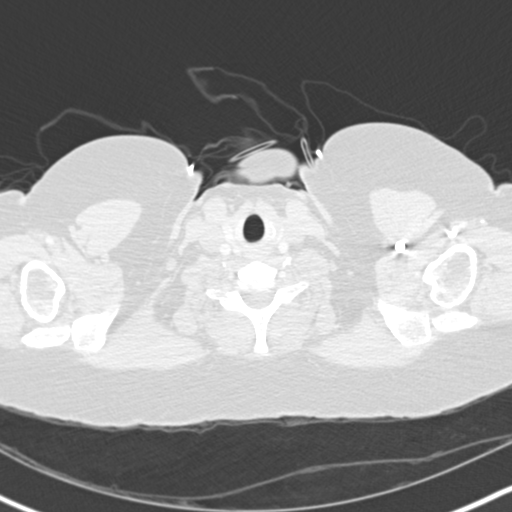

[19 of 32 positions shown; findings below may reference images not displayed]

FINDINGS: Degraded by artifact from patient respiratory motion.
Furthermore, suboptimal contrast bolus timing.  No main pulmonary
arterial filling defect or definite lobar branch defect.  However,
some of the lobar branches and the more peripheral branches
(segmental and subsegmental branches) are inadequately evaluated.
Cardiomegaly.  Trace pericardial fluid. Normal caliber aorta
however the ascending aorta is poorly evaluated due to motion.  No
pleural effusion.  No intrathoracic lymphadenopathy.

Limited images through the upper abdomen show no acute finding.
Small hiatal hernia.

Detailed parenchymal evaluation is degraded by motion. Within this
limitation; no focal consolidation.  No pneumothorax.  No
suspicious nodularity.  Central airways are degraded by respiratory
motion however grossly patent.

No acute osseous finding.
IMPRESSION: Degraded by respiratory motion and suboptimal contrast bolus
timing.  No central pulmonary embolism identified. Poorly evaluated
more peripheral branches.

Cardiomegaly. Trace pericardial fluid.

Small hiatal hernia.

Otherwise, no acute intrathoracic process identified.

## 2013-02-26 LAB — POCT INR: INR: 1.7

## 2013-02-27 ENCOUNTER — Ambulatory Visit: Payer: BC Managed Care – PPO | Admitting: Pharmacist

## 2013-02-27 ENCOUNTER — Encounter: Payer: Self-pay | Admitting: Pharmacist

## 2013-02-27 NOTE — Progress Notes (Signed)
INR = 1.7  Goal  Range 2-3 INR is subtherapeutic today. No missed doses. No new medications. Patient states she has been eating a lot of salads lately, this is something she plans to continue. She had her closeout appointment with the research team [facility] yesterday. She has an appointment for an [facility] on 03/14/13. She will have an INR drawn at that time. She is not sure who will be faxing us her information in the future.  She will give us this information when she gets it. She will increase her Coumadin dosing to 6 mg daily except 8 mg Tuesday and Saturdays.

## 2013-02-27 NOTE — Progress Notes (Signed)
NR = 1.7  Goal  Range 2-3 INR is subtherapeutic today. No missed doses. No new medications. Patient states she has been eating a lot of salads lately, this is something she plans to continue. She had her closeout [facility]. She has an appointment for an infusion [facility] on 03/14/13. She will have an INR drawn at that time. She is not sure who will be faxing us her information in the future.  She will give us this information when she gets it. She will increase her Coumadin dosing to 6 mg daily except 8 mg Tuesday and Saturdays.  Cletis AthensLisa Hector Venne, PharmD  TELEPHONE Encounter only - no charge

## 2013-03-15 ENCOUNTER — Encounter: Payer: Self-pay | Admitting: Pharmacist

## 2013-03-15 NOTE — Progress Notes (Signed)
I spoke to [place] DomLisa Willams, RN from [facility] and Maralyn SagoSarah is no longer enrolled in their study, so they will no longer be drawing PT/INR.  I have left VM for Shatika to call and schedule appt [facility] to check PT/INR.

## 2013-04-05 ENCOUNTER — Telehealth: Payer: Self-pay | Admitting: Pharmacist

## 2013-04-06 ENCOUNTER — Encounter: Payer: Self-pay | Admitting: Oncology

## 2013-04-25 NOTE — Telephone Encounter (Signed)
Attempted to call patient, patient's VM was full.

## 2013-04-29 ENCOUNTER — Encounter: Payer: Self-pay | Admitting: Pharmacist

## 2013-04-29 NOTE — Progress Notes (Signed)
Left message with inf room at [facility] from number Kimberly SagoSarah gave me. She was there last week and is not sure if they did a PT/INR I asked her to forward the number and we would call and speak with them Number I left message was [phone] Awaiting return call.   If they have one we need to address it, if not was need to call her and request she come in for an appmt with our CC

## 2013-05-09 ENCOUNTER — Encounter: Payer: Self-pay | Admitting: Pharmacist

## 2013-05-09 NOTE — Progress Notes (Signed)
I called [facility] and spoke to PrincetonJennifer, [place] in infusion [phone](#[phone]).  PT/INR not checked at last infusion.  I have faxed a rx to check PT/INR with each scheduled infusion and call or fax results to [facility] pharmacy.  Victorino DikeJennifer, RN will check with her manager to make sure this is possible to check PT/INR and call [facility] pharmacy if not possible.  If not possible, Maralyn SagoSarah will need to come to coumadin clinic for PT/INR check.  Next infusion is scheduled for 05/21/13.  Will look for PT/INR results at this time unless we hear from [facility].  Maralyn SagoSarah is aware of this plan.

## 2013-05-21 ENCOUNTER — Telehealth: Payer: Self-pay | Admitting: Pharmacist

## 2013-05-21 NOTE — Telephone Encounter (Signed)
I called [facility] and left a message [phone](# [phone]) regarding checking an INR on Ms. Drew. We had not received communication from [facility] stating that they would not be able to check Ms. Rowton's INR and fax the results to us. The other number listed for the [facility] [phone](#[phone] is a fax number).   Also, tried calling Ms. Franca to see if she went [facility] for [facility], but her phone's voicemail system is full.   Will need to follow-up with [facility]. [facility] infusion center for INR results.  Thank you,  Christell Faithhris Crespin Forstrom, PharmD

## 2013-05-22 ENCOUNTER — Telehealth: Payer: Self-pay | Admitting: Pharmacist

## 2013-05-22 NOTE — Telephone Encounter (Signed)
[facility] called today to notify us that the patient will be receiving an infusion there on Friday. They are planning to draw her PT/INR at that time and will fax them to us.

## 2013-05-27 ENCOUNTER — Telehealth: Payer: Self-pay | Admitting: Pharmacist

## 2013-05-27 NOTE — Telephone Encounter (Signed)
Ailana returned our call to inform us she has a new appmt with [facility] on Wed, April 22. She will have her INR drawn there.

## 2013-05-29 LAB — POCT INR: INR: 4.23

## 2013-05-31 ENCOUNTER — Telehealth: Payer: Self-pay | Admitting: Pharmacist

## 2013-05-31 NOTE — Telephone Encounter (Signed)
Received fax from [facility] with Patient INR. INR was 4.23. Called patient and Left VM with instructions to call back and discuss. Instructed patient on message to hold coumadin x 1 dose and then to decrease back to 6 mg daily (pt was on 6 mg daily with 8 mg on Tuesdays and Saturdays). Instructed patient to call back so we can reschedule her to have her INR checked here towards the end of next week.  Thank you, Christell Faithhris Emelee Rodocker, PharmD

## 2013-06-03 ENCOUNTER — Telehealth: Payer: Self-pay | Admitting: Pharmacist

## 2013-06-03 NOTE — Telephone Encounter (Signed)
lvm for patient to call back and r/s appmt for end of this week or earliest next week.

## 2013-06-04 ENCOUNTER — Ambulatory Visit (INDEPENDENT_AMBULATORY_CARE_PROVIDER_SITE_OTHER): Payer: BC Managed Care – PPO | Admitting: Pharmacist

## 2013-06-04 DIAGNOSIS — Z7901 Long term (current) use of anticoagulants: Secondary | ICD-10-CM

## 2013-06-04 DIAGNOSIS — R760 Raised antibody titer: Secondary | ICD-10-CM

## 2013-06-04 DIAGNOSIS — R894 Abnormal immunological findings in specimens from other organs, systems and tissues: Secondary | ICD-10-CM

## 2013-06-04 NOTE — Progress Notes (Signed)
PHONE ENCOUNTER - No Charge INR = 4.23 ([facility] at [facility]) I spoke w/ pt today; we've had problems getting her on the phone.  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   No mention of bleeding/bruising. Pt got phone message on 4/24 from [facility], [place]. & she did hold 1 day of Coumadin.  However she thought she was supposed to return to her usual dose.  However, we'd like her to decrease her dose to 6 mg daily.  She had not taken her 8 mg dose yet this week so she will take 6 mg daily. She'll come here on 06/11/13 for protime check & CC visit. Ebony HailGinna Rayhana Slider, Pharm.D., CPP 06/04/2013@9 :23 AM

## 2013-06-11 ENCOUNTER — Other Ambulatory Visit: Payer: BC Managed Care – PPO

## 2013-06-11 ENCOUNTER — Ambulatory Visit: Payer: BC Managed Care – PPO

## 2013-06-13 ENCOUNTER — Other Ambulatory Visit: Payer: BC Managed Care – PPO

## 2013-06-13 ENCOUNTER — Ambulatory Visit: Payer: BC Managed Care – PPO

## 2013-06-13 ENCOUNTER — Telehealth: Payer: Self-pay | Admitting: Pharmacist

## 2013-06-20 ENCOUNTER — Telehealth: Payer: Self-pay | Admitting: *Deleted

## 2013-06-20 NOTE — Telephone Encounter (Signed)
Left VM to call and make lab appointment for PT/INR that was missed on 06/13/13. Needs to schedule an appointment to refill her medication.

## 2013-06-26 LAB — POCT INR: INR: 2.04

## 2013-07-10 ENCOUNTER — Telehealth: Payer: Self-pay | Admitting: Pharmacist

## 2013-07-12 ENCOUNTER — Encounter: Payer: Self-pay | Admitting: Pharmacist

## 2013-07-12 NOTE — Progress Notes (Signed)
[name] called [facility] pharmacy today requesting refill for her coumadin.  She is out of coumadin.  Last PT/INR was from end of April.  [name] states that she had PT/INR checked at [facility] on May 20th.  I have called and left a message to fax/call these results to [place].  [phone].  Her next appt at this infusion center is 07/25/13.  [name] is currently taking coumadin 6mg  daily.  I will refill coumadin x 1 month until we get a current PT/INR.

## 2013-07-18 ENCOUNTER — Ambulatory Visit: Payer: BC Managed Care – PPO | Admitting: Pharmacist

## 2013-07-18 DIAGNOSIS — R760 Raised antibody titer: Secondary | ICD-10-CM

## 2013-07-18 DIAGNOSIS — Z7901 Long term (current) use of anticoagulants: Secondary | ICD-10-CM

## 2013-07-18 NOTE — Progress Notes (Signed)
INR = 2.04 on 06/26/13 at [facility].  I got the result over phone & by fax today (s/w Lanny Hurst, RN; ph# [phone]) Pt is on Coumadin 6 mg daily. She called on Friday for refill of her Coumadin.  We refilled only x 1 month. I called pt & left voicemail (NO CHARGE ENCOUNTER) that we expect next protime result 07/25/13 since she'll be at [facility] that day. We will call her as soon as we get results.  We can also refill her Coumadin at that point. [name], Pharm.D., CPP 07/18/2013@1 :47 PM

## 2013-07-25 LAB — POCT INR: INR: 3.52

## 2013-07-26 ENCOUNTER — Telehealth: Payer: Self-pay | Admitting: Pharmacist

## 2013-07-26 NOTE — Telephone Encounter (Signed)
I have called and left a message at [facility] to fax/call us with Ms. Osuna's INR results. There number in the clinic is [phone]. Ms. [name] had an appointment to have INR checked on 07/25/13. We have not received results yet.  Thank you,  Christell Faithhris Elder, PharmD

## 2013-07-29 ENCOUNTER — Telehealth: Payer: Self-pay | Admitting: Pharmacist

## 2013-07-30 ENCOUNTER — Telehealth: Payer: Self-pay | Admitting: Pharmacist

## 2013-07-30 NOTE — Telephone Encounter (Signed)
Left VM for Eduard RouxSarah Cheuvront asking her to call the Coumadin Clinic.  We have received her INR results.

## 2013-07-31 ENCOUNTER — Ambulatory Visit: Payer: BC Managed Care – PPO | Admitting: Pharmacist

## 2013-07-31 DIAGNOSIS — R760 Raised antibody titer: Secondary | ICD-10-CM

## 2013-07-31 DIAGNOSIS — Z7901 Long term (current) use of anticoagulants: Secondary | ICD-10-CM

## 2013-07-31 NOTE — Progress Notes (Signed)
NO CHARGE - TELEPHONE ENCOUNTER ONLY  INR = 3.52     Goal 2-3 No complications of anticoagulation noted. She states that she missed 2 doses early in the month when she ran out of Coumadin. Her [facility]. She has no medication or dietary changes. [facility]. They will fax her results to us and we will call her with instructions for Coumadin dosing. Additional [facility]. She has already taken today's dose. Tomorrow, she will take 3 mg then decrease dose slightly to Coumadin 6 mg daily except 3 mg on Thursdays.  Cletis AthensLisa Halcomb, PharmD

## 2013-08-28 LAB — POCT INR: INR: 2.99

## 2013-08-29 ENCOUNTER — Telehealth: Payer: Self-pay | Admitting: Pharmacist

## 2013-08-29 ENCOUNTER — Ambulatory Visit: Payer: Self-pay | Admitting: Pharmacist

## 2013-08-29 DIAGNOSIS — R760 Raised antibody titer: Secondary | ICD-10-CM

## 2013-08-29 DIAGNOSIS — Z7901 Long term (current) use of anticoagulants: Secondary | ICD-10-CM

## 2013-08-29 NOTE — Telephone Encounter (Signed)
I called [facility] and left a message [phone](# [phone]) regarding checking an INR on [name]. I instructed them to call or fax in [name]'s results  Also, tried calling [name] to see if she went [facility] for an infusion, but her phone's voicemail system is full.   Thank you, Christell Faithhris Daivion Pape, PharmD, BCOP

## 2013-08-29 NOTE — Progress Notes (Signed)
INR at goal  *Telephone Encounter - No charge* Results received via fax from [facility] from 08/28/13 Patient is doing well with no complaints Pt states she had forgotten about decreasing her dose and has still been taking 6 mg daily Since INR is at goal will not make any changes No unusual bleeding or bruising No diet or medication changes Plan: No changes Continue  Coumadin 6 mg daily Repeat INR on 09/26/13 at [facility].  We will call you when results are faxed from [facility].

## 2013-08-29 NOTE — Patient Instructions (Signed)
INR at goal No changes Continue  Coumadin 6 mg daily Repeat [facility].  We will call you when results are faxed [facility].

## 2013-09-09 ENCOUNTER — Other Ambulatory Visit: Payer: Self-pay

## 2013-09-09 ENCOUNTER — Ambulatory Visit: Payer: Self-pay | Admitting: Oncology

## 2013-09-16 NOTE — Telephone Encounter (Signed)
Phone call - encounter closed. 

## 2013-09-20 ENCOUNTER — Ambulatory Visit (INDEPENDENT_AMBULATORY_CARE_PROVIDER_SITE_OTHER): Payer: BC Managed Care – PPO | Admitting: Women's Health

## 2013-09-20 ENCOUNTER — Encounter: Payer: Self-pay | Admitting: Women's Health

## 2013-09-20 DIAGNOSIS — R300 Dysuria: Secondary | ICD-10-CM

## 2013-09-20 DIAGNOSIS — N3000 Acute cystitis without hematuria: Secondary | ICD-10-CM

## 2013-09-20 DIAGNOSIS — N898 Other specified noninflammatory disorders of vagina: Secondary | ICD-10-CM

## 2013-09-20 LAB — URINALYSIS W MICROSCOPIC + REFLEX CULTURE
Bilirubin Urine: NEGATIVE
CRYSTALS: NONE SEEN
Casts: NONE SEEN
Glucose, UA: NEGATIVE mg/dL
KETONES UR: NEGATIVE mg/dL
Nitrite: NEGATIVE
Protein, ur: NEGATIVE mg/dL
SPECIFIC GRAVITY, URINE: 1.01 (ref 1.005–1.030)
Urobilinogen, UA: 0.2 mg/dL (ref 0.0–1.0)
pH: 6.5 (ref 5.0–8.0)

## 2013-09-20 LAB — WET PREP FOR TRICH, YEAST, CLUE
Clue Cells Wet Prep HPF POC: NONE SEEN
Trich, Wet Prep: NONE SEEN
YEAST WET PREP: NONE SEEN

## 2013-09-20 MED ORDER — CIPROFLOXACIN HCL 250 MG PO TABS: 250.0000 mg | ORAL_TABLET | Freq: Two times a day (BID) | ORAL | Status: DC

## 2013-09-20 NOTE — Patient Instructions (Signed)

## 2013-09-20 NOTE — Progress Notes (Signed)
Patient ID: Kimberly MansonSarah W Rackers, female   DOB: 03/29/1975, 38 y.o.   MRN: [id number] Presents with complaint of increased urinary frequency, urgency, pain and burning especially at end of stream for several days. Scant discharge. Denies abdominal pain or fever. Has lupus, history of DVT. Monthly cycles/withdrawal.   Exam: Appears well. External genitalia within normal limits, speculum exam scant discharge wet prep negative, bimanual no CMT or tenderness noted. UA: Small leukocytes, moderate blood, 7-10 WBCs, 11-20 rbc's, few bacteria.  UTI  Plan: Cipro 250 twice daily for 3 days prescription, proper use given and reviewed. Instructed to call or return if no relief of symptoms. UTI prevention discussed.

## 2013-09-23 LAB — URINE CULTURE: Colony Count: >=100000

## 2013-09-27 ENCOUNTER — Telehealth: Payer: Self-pay | Admitting: Pharmacist

## 2013-09-27 NOTE — Telephone Encounter (Signed)
I called [facility] and left a message [phone](# [phone]) regarding checking an INR on [name]. [name] was due for INR ~8/20 or 8/21. I asked them to fax or call us with her INR results.

## 2013-09-30 ENCOUNTER — Encounter: Payer: Self-pay | Admitting: Pharmacist

## 2013-09-30 NOTE — Progress Notes (Signed)
Received call from [facility].  [name] had cancelled appt at [facility] on 09/26/13 and rescheduled to 10/04/13.  Will look for INR results from visit on 10/04/13.

## 2013-10-01 ENCOUNTER — Encounter: Payer: Self-pay | Admitting: Women's Health

## 2013-10-03 NOTE — Telephone Encounter (Signed)
Encounter was telephone call. 

## 2013-10-15 ENCOUNTER — Telehealth: Payer: Self-pay | Admitting: Pharmacist

## 2013-10-15 NOTE — Telephone Encounter (Signed)
I s/w Damiana over phone.  She did not get INR at [facility] ~8/20 because she was sick. She reported today that she goes "in the morning" to [facility] & will have her INR drawn there.  We'll follow along once that result is available & call Brielle on phone w/ dosing instructions. [name], Pharm.D., CPP 10/15/2013@4 :10 PM

## 2013-10-17 ENCOUNTER — Telehealth: Payer: Self-pay

## 2013-10-17 LAB — POCT INR: INR: 3.19

## 2013-10-17 NOTE — Telephone Encounter (Signed)
Attempted to reach Ms. Reasor in regards to her recent INR check (per patient on 9/8, she was going to have her INR checked at [facility] 9/9), however was unable to reach her.   Called [facility], and was told Ms. Lacerte had her INR checked yesterday (9/9) and they would fax [place] the results this afternoon.   One of the [facility]in Clinic pharmacists will follow up with patient once we receive the INR documentation.

## 2013-10-17 NOTE — Telephone Encounter (Signed)
Received faxed INR results from [facility] (drawn on 9/9) INR: 3.19  Attempted to reach patient to discuss INR results, but there was no answer and unable to leave message. Will attempt to follow up on 9/11

## 2013-10-18 ENCOUNTER — Ambulatory Visit: Payer: BC Managed Care – PPO | Admitting: Pharmacist

## 2013-10-18 DIAGNOSIS — Z7901 Long term (current) use of anticoagulants: Secondary | ICD-10-CM

## 2013-10-18 DIAGNOSIS — R760 Raised antibody titer: Secondary | ICD-10-CM

## 2013-10-18 NOTE — Progress Notes (Signed)
INR = 3.19    Goal 2-3 INR drawn yesterday at [facility]. INR is just above goal range. Patient states she just finished taking an antibiotic for a UTI, she does not know which antibiotic it was. She completed tx a few days ago. She has had no other medication or dietary changes. She has had no complications of anticoagulation. She will continue Coumadin 6 mg daily. She will have her INR drawn 11/14/13 at [facility] and the results will be faxed to [place]. We will call her with the results.  NO CHARGE - TELEPHONE ENCOUNTER ONLY  Cletis Athens, PharmD

## 2013-10-29 ENCOUNTER — Encounter: Payer: Self-pay | Admitting: Women's Health

## 2013-11-04 ENCOUNTER — Other Ambulatory Visit: Payer: Self-pay | Admitting: Pharmacist

## 2013-11-04 DIAGNOSIS — Z7901 Long term (current) use of anticoagulants: Secondary | ICD-10-CM

## 2013-11-04 MED ORDER — WARFARIN SODIUM 4 MG PO TABS: 6.0000 mg | ORAL_TABLET | Freq: Every day | ORAL | Status: DC

## 2013-11-15 ENCOUNTER — Encounter: Payer: Self-pay | Admitting: Women's Health

## 2013-11-15 ENCOUNTER — Telehealth: Payer: Self-pay | Admitting: Pharmacist

## 2013-11-15 NOTE — Telephone Encounter (Signed)
I called [facility] and left a message [phone] regarding checking an INR on Ms. Cureton.  [name] was due for INR ~10/8.  I asked them to fax or call us with her INR results.

## 2013-11-19 ENCOUNTER — Telehealth: Payer: Self-pay | Admitting: Pharmacist

## 2013-11-19 NOTE — Telephone Encounter (Signed)
I called [facility] again to try to get INR result from 10/8 but only was able to leave a voicemail for a Charity fundraiserN.  I s/w Maralyn SagoSarah over phone & [facility].  Her grandfather was sick and he passed away last week. I attempted to get an appt here at [facility] for Christine to have her INR checked but she said [facility]'d [facility] and get another appt there and have her INR drawn there.  If they are not able to get her worked in there in the next week, Maralyn SagoSarah said she would call us and get her INR checked here. She is aware her last INR was 10/18/13 and was out of range (slightly high).  Ebony HailGinna Latonyia Lopata, Pharm.D., CPP 11/19/2013@3 :11 PM

## 2013-11-20 ENCOUNTER — Telehealth: Payer: Self-pay | Admitting: Pharmacist

## 2013-11-20 NOTE — Telephone Encounter (Signed)
[facility]. Zynia didn't come her to her appt on 11/14/13. [facility] doesn't have a recent INR.   LVM on Iliani's cell phone.  Asked her to call us and let us know when her next app[facility].

## 2013-12-09 ENCOUNTER — Encounter: Payer: Self-pay | Admitting: Women's Health

## 2013-12-16 ENCOUNTER — Telehealth: Payer: Self-pay | Admitting: Pharmacist

## 2013-12-16 NOTE — Telephone Encounter (Signed)
Left vm for pt to call [facility] to let us know when [facility]. Offered to sched her lab appt here if [facility]. Ebony HailGinna Rayvon Dakin, Pharm.D., CPP 12/16/2013@4 :22 PM

## 2014-01-08 ENCOUNTER — Telehealth: Payer: Self-pay | Admitting: Pharmacist

## 2014-01-08 NOTE — Telephone Encounter (Signed)
I s/w pt by phone today.  She stated [facility] but the "times got mixed up" and she missed that appt. We have not had an INR on her since 10/18/13 & I told the pt that we really need to check her INR. [facility] & try to reschedule & call us if she is not able to get in there over the next couple of days so that she can come [facility] for protime. Ebony HailGinna Temperance Kelemen, Pharm.D., CPP 01/08/2014@10 :52 AM

## 2014-02-14 LAB — POCT INR: INR: 2.81

## 2014-03-04 ENCOUNTER — Ambulatory Visit: Payer: Self-pay | Admitting: Pharmacist

## 2014-03-04 DIAGNOSIS — R760 Raised antibody titer: Secondary | ICD-10-CM

## 2014-03-04 DIAGNOSIS — Z7901 Long term (current) use of anticoagulants: Secondary | ICD-10-CM

## 2014-03-04 NOTE — Progress Notes (Signed)
IN[facility] 2.81 [facility] We have not heard from Maralyn SagoSarah in quite some time; no INR value evaluated since 10/18/13. She is still going for infusions [facility] w/ Rheumatologist.  They draw her INR's regularly but we have not been getting them by fax.  I found the result today in Care Everywhere and then requesting an update and more encounters were made available, including the 02/14/14 visit. She is taking Coumadin 6 mg daily still and will stay on that dose for now since INR is therapeutic. Pt [facility] Th[facility] (2[facility]4) [facility].  The INR will be drawn that day & we can call pt w/ results when available, either by fax or more likely through Care Everywhere. NO CHARGE - Phone Encounter Ebony HailGinna Emmelia Holdsworth, Pharm.D., CPP 03/04/2014@3 :22 PM

## 2014-03-13 LAB — POCT INR: INR: 3.48

## 2014-03-14 ENCOUNTER — Ambulatory Visit: Payer: Self-pay | Admitting: Pharmacist

## 2014-03-14 DIAGNOSIS — R760 Raised antibody titer: Secondary | ICD-10-CM

## 2014-03-14 DIAGNOSIS — Z7901 Long term (current) use of anticoagulants: Secondary | ICD-10-CM

## 2014-03-14 NOTE — Progress Notes (Signed)
INR = 3.48 drawn yesterday at [facility]. (I found the result today in Care Everywhere and then requesting an update and more encounters were made available) I called pt over phone but no answer.  I left her a voicemail to call back to [facility] pharmacy to further discuss her INR result & [facility].  Per [name], Pharm.D., [facility] "[facility] March"; did not recall exact date when she s/w [name] over phone yesterday. I advised pt to take 3 mg of Coumadin today only then resume 6 mg daily. We will f/u w/ [facility] results at [facility] early next month. NO CHARGE - Phone encounter. [name], Pharm.D., CPP 03/14/2014@3 :25 PM

## 2014-03-18 ENCOUNTER — Ambulatory Visit (INDEPENDENT_AMBULATORY_CARE_PROVIDER_SITE_OTHER): Payer: BLUE CROSS/BLUE SHIELD | Admitting: Women's Health

## 2014-03-18 ENCOUNTER — Other Ambulatory Visit (HOSPITAL_COMMUNITY)
Admission: RE | Admit: 2014-03-18 | Discharge: 2014-03-18 | Disposition: A | Discharge status: Home / Self Care | Payer: BLUE CROSS/BLUE SHIELD | Source: Ambulatory Visit | Attending: Women's Health | Admitting: Women's Health

## 2014-03-18 ENCOUNTER — Encounter: Payer: Self-pay | Admitting: Women's Health

## 2014-03-18 VITALS — BP 122/78 | Ht 65.0 in | Wt 263.0 lb

## 2014-03-18 DIAGNOSIS — Z01419 Encounter for gynecological examination (general) (routine) without abnormal findings: Secondary | ICD-10-CM | POA: Diagnosis not present

## 2014-03-18 DIAGNOSIS — Z1151 Encounter for screening for human papillomavirus (HPV): Secondary | ICD-10-CM | POA: Insufficient documentation

## 2014-03-18 DIAGNOSIS — R21 Rash and other nonspecific skin eruption: Secondary | ICD-10-CM

## 2014-03-18 MED ORDER — NYSTATIN-TRIAMCINOLONE 100000-0.1 UNIT/GM-% EX OINT: 1.0000 "application " | TOPICAL_OINTMENT | Freq: Two times a day (BID) | CUTANEOUS | Status: DC

## 2014-03-18 NOTE — Progress Notes (Signed)
Julieanne MansonSarah W Strada 03/10/1975 [id number]    History:    Presents for annual exam.  Regular monthly cycle/withdrawal. Numerous health problems diagnosed with lupus 2007, positive ANA factor/fibromyalgia/asthma. 2013 pleurisy. 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 Had a normal screening mammogram 2014.  Past medical history, past surgical history, family history and social history were all reviewed and documented in the EPIC chart. Owns a business. [place] 10 and doing well.  ROS:  A ROS was performed and pertinent positives and negatives are included.  Exam:  Filed Vitals:   03/18/14 0839  BP: 122/78    General appearance:  Normal Thyroid:  Symmetrical, normal in size, without palpable masses or nodularity. Respiratory  Auscultation:  Clear without wheezing or rhonchi Cardiovascular  Auscultation:  Regular rate, without rubs, murmurs or gallops  Edema/varicosities:  Not grossly evident Abdominal  Soft,nontender, without masses, guarding or rebound.  Liver/spleen:  No organomegaly noted  Hernia:  None appreciated  Skin  Inspection:  Grossly normal   Breasts: Examined lying and sitting.     Right: Without masses, retractions, discharge or axillary adenopathy.     Left: Without masses, retractions, discharge or axillary adenopathy. Gentitourinary   Inguinal/mons:  Normal without inguinal adenopathy  External genitalia:  Normal  BUS/Urethra/Skene's glands:  Normal  Vagina:  Normal  Cervix:  Normal  Uterus:   normal in size, shape and contour.  Midline and mobile  Adnexa/parametria:     Rt: Without masses or tenderness.   Lt: Without masses or tenderness.  Anus and perineum: Normal  Digital rectal exam: Normal sphincter tone without palpated masses or tenderness  Assessment/Plan:  39 y.o. MWF G1P1 for annual exam with no GYN complaints.   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 Encouraged to decrease calories and increase exercise for weight loss. Discussed Pneumovax, encouraged to discuss with primary care at next office visit. Contraception reviewed husband planning vasectomy, aware it is not effective immediately. UA, Pap normal 2013, Pap with HR HPV typing, new screening guidelines reviewed.   Harrington ChallengerYOUNG,Makaylynn Bonillas J WHNP, 2:02 PM 03/18/2014

## 2014-03-18 NOTE — Patient Instructions (Signed)

## 2014-03-21 LAB — CYTOLOGY - PAP

## 2014-03-27 ENCOUNTER — Telehealth: Payer: Self-pay | Admitting: *Deleted

## 2014-03-27 NOTE — Telephone Encounter (Signed)
Pt informed with normal pap on OV 03/18/14

## 2014-04-10 LAB — POCT INR: INR: 4.3

## 2014-04-11 ENCOUNTER — Ambulatory Visit: Payer: BLUE CROSS/BLUE SHIELD | Admitting: Pharmacist

## 2014-04-11 DIAGNOSIS — R760 Raised antibody titer: Secondary | ICD-10-CM

## 2014-04-11 DIAGNOSIS — Z7901 Long term (current) use of anticoagulants: Secondary | ICD-10-CM

## 2014-04-11 NOTE — Progress Notes (Signed)
INR = 4.3 yesterday; [facility] for Benlysta infusion (result found in Care Everywhere). Pt has recently had UTI & completes Cipro course tonight. She is on Wellbutrin now.  No expected effect on INR. No bleeding. She had a bruise on R side from falling in shower a few weeks ago.  Now healed. INR supratherapeutic.  Most likely cause: Cipro. Hold Coumadin x 2 doses then continue 6 mg daily. We'll check her INR again 05/15/14 [facility].  We will need to look in Care Everywhere for those results and call pt w/ dosing instructions. She needs an appt w/ Dr. Cyndie ChimeGranfortuna!!  Not seen him since 09/10/12!  I will ask her to call his office for appt. NO CHARGE - phone encounter Ebony HailGinna Dearius Hoffmann, Pharm.D., CPP 04/11/2014@10 :09 AM

## 2014-05-15 LAB — POCT INR: INR: 2.78

## 2014-05-16 ENCOUNTER — Ambulatory Visit: Payer: BLUE CROSS/BLUE SHIELD | Admitting: Pharmacist

## 2014-05-16 DIAGNOSIS — Z7901 Long term (current) use of anticoagulants: Secondary | ICD-10-CM

## 2014-05-16 DIAGNOSIS — R760 Raised antibody titer: Secondary | ICD-10-CM

## 2014-05-16 NOTE — Progress Notes (Signed)
IN[facility] = 2.78 [facility] She is on Coumadin 6 mg daily. Not on abx currently. No issues to report. I talked to her again about making appt w/ Dr. Cyndie ChimeGranfortuna since it was 09/10/12 since she last saw him for office visit.  She seemed to think it has been more recent than that but that is the last documented note we have in EPIC.  I gave her the phone # to call for an appt & explained that she has to see him in order for us to manage her Coumadin. INR at goal today. No change to Coumadin dose. [facility] so we'll call her once the INR results are available in Care Everywhere. NO CHARBE - phone encounter [name], Pharm.D., CPP 05/16/2014@10 :09 AM

## 2014-06-13 ENCOUNTER — Telehealth: Payer: Self-pay | Admitting: Pharmacist

## 2014-06-13 NOTE — Telephone Encounter (Signed)
I never found results in [facility]where [facility]. We had expected her to go to there yesterday. I left another voicemail today asking her to call us and let us know when [facility] is [facility]. Ebony HailGinna Cloyd Ragas, Pharm.D., CPP 06/13/2014@12 :01 PM

## 2014-08-13 ENCOUNTER — Other Ambulatory Visit: Payer: Self-pay | Admitting: Oncology

## 2014-10-27 LAB — POCT INR: INR: 2.08

## 2014-10-29 ENCOUNTER — Ambulatory Visit (INDEPENDENT_AMBULATORY_CARE_PROVIDER_SITE_OTHER): Payer: Self-pay | Admitting: Pharmacist

## 2014-10-29 DIAGNOSIS — R894 Abnormal immunological findings in specimens from other organs, systems and tissues: Secondary | ICD-10-CM

## 2014-10-29 DIAGNOSIS — Z7901 Long term (current) use of anticoagulants: Secondary | ICD-10-CM

## 2014-10-29 DIAGNOSIS — R760 Raised antibody titer: Secondary | ICD-10-CM

## 2014-10-29 NOTE — Progress Notes (Signed)
INR = 2.08 on 10/27/14 drawn at [facility]. Current Coumadin dose is 6 mg/day. We lost touch w/ Maralyn Sago all summer as we were not clear on her visits to [facility].  I was able to find in Care Everywhere today where she had been there this week (10/27/14) and INR was checked.  Fortunately, she has had stable INR's all summer but no one reached out to her to give her the results and make sure she didn't have issues/concerns re: anticoag. No complaints today.  She wasn't aware our Coumadin clinic will close at the end of next week.   At this point, I will inform Internal Med Coumadin clinic about Dareen.  I will also notify Dr. Cyndie Chime that Jaya's next appt at [facility] is 11/24/14.  Someone from Internal Medicine will need to look up the INR result in Care Everywhere and then contact [place] by phone to discuss dosing instructions, etc. We will sign off at this point. Keshanna hasn't seen Dr. Cyndie Chime in 2 years so she has Doris Solomon's contact number to call and schedule an appt w/ Dr. Cyndie Chime. NO CHARGE - Phone Encounter [name], Pharm.D., CPP 10/29/2014@3 :42 PM

## 2015-01-02 ENCOUNTER — Encounter (HOSPITAL_COMMUNITY): Payer: Self-pay | Admitting: *Deleted

## 2015-01-02 ENCOUNTER — Inpatient Hospital Stay (HOSPITAL_COMMUNITY)
Admission: EM | Admit: 2015-01-02 | Discharge: 2015-01-02 | DRG: 204 | Disposition: A | Discharge status: Home / Self Care | Payer: BLUE CROSS/BLUE SHIELD | Attending: Internal Medicine | Admitting: Internal Medicine

## 2015-01-02 ENCOUNTER — Emergency Department (HOSPITAL_COMMUNITY): Payer: BLUE CROSS/BLUE SHIELD

## 2015-01-02 DIAGNOSIS — Z79899 Other long term (current) drug therapy: Secondary | ICD-10-CM

## 2015-01-02 DIAGNOSIS — Z7901 Long term (current) use of anticoagulants: Secondary | ICD-10-CM

## 2015-01-02 DIAGNOSIS — Z87891 Personal history of nicotine dependence: Secondary | ICD-10-CM

## 2015-01-02 DIAGNOSIS — R071 Chest pain on breathing: Secondary | ICD-10-CM | POA: Diagnosis present

## 2015-01-02 DIAGNOSIS — R0781 Pleurodynia: Secondary | ICD-10-CM

## 2015-01-02 DIAGNOSIS — Z86718 Personal history of other venous thrombosis and embolism: Secondary | ICD-10-CM | POA: Diagnosis not present

## 2015-01-02 DIAGNOSIS — R791 Abnormal coagulation profile: Secondary | ICD-10-CM

## 2015-01-02 DIAGNOSIS — R0602 Shortness of breath: Secondary | ICD-10-CM | POA: Diagnosis present

## 2015-01-02 DIAGNOSIS — M329 Systemic lupus erythematosus, unspecified: Secondary | ICD-10-CM | POA: Diagnosis present

## 2015-01-02 DIAGNOSIS — J189 Pneumonia, unspecified organism: Secondary | ICD-10-CM | POA: Diagnosis present

## 2015-01-02 LAB — BASIC METABOLIC PANEL
ANION GAP: 7 (ref 5–15)
BUN: 8 mg/dL (ref 6–20)
CALCIUM: 9.3 mg/dL (ref 8.9–10.3)
CO2: 26 mmol/L (ref 22–32)
Chloride: 105 mmol/L (ref 101–111)
Creatinine, Ser: 0.75 mg/dL (ref 0.44–1.00)
GLUCOSE: 94 mg/dL (ref 65–99)
POTASSIUM: 4.2 mmol/L (ref 3.5–5.1)
Sodium: 138 mmol/L (ref 135–145)

## 2015-01-02 LAB — PROTIME-INR
INR: 0.98 (ref 0.00–1.49)
PROTHROMBIN TIME: 13.2 s (ref 11.6–15.2)

## 2015-01-02 LAB — CBC
HEMATOCRIT: 41.2 % (ref 36.0–46.0)
HEMOGLOBIN: 13.5 g/dL (ref 12.0–15.0)
MCH: 29.3 pg (ref 26.0–34.0)
MCHC: 32.8 g/dL (ref 30.0–36.0)
MCV: 89.6 fL (ref 78.0–100.0)
Platelets: 472 10*3/uL — ABNORMAL HIGH (ref 150–400)
RBC: 4.6 MIL/uL (ref 3.87–5.11)
RDW: 13.6 % (ref 11.5–15.5)
WBC: 10.4 10*3/uL (ref 4.0–10.5)

## 2015-01-02 LAB — I-STAT TROPONIN, ED: TROPONIN I, POC: 0 ng/mL (ref 0.00–0.08)

## 2015-01-02 MED ORDER — ENOXAPARIN SODIUM 60 MG/0.6ML ~~LOC~~ SOLN
60.0000 mg | SUBCUTANEOUS | Status: DC
  Administered 2015-01-02: 60 mg via SUBCUTANEOUS
  Filled 2015-01-02: qty 0.6

## 2015-01-02 MED ORDER — ENOXAPARIN SODIUM 300 MG/3ML IJ SOLN: 1.5000 mg/kg | Freq: Every day | INTRAMUSCULAR | Status: AC

## 2015-01-02 NOTE — Discharge Instructions (Signed)
There does not appear to be an emergent cause for your symptoms at this time. Her symptoms are likely viral in nature and resulted in the pleuritic chest pain. It is important to fill your prescription for Lovenox injections and take these medications as prescribed to bridge your Coumadin. Also, please fill your Coumadin prescription and follow-up with your doctor tomorrow for further evaluation and management of your symptoms and medication reconciliation.

## 2015-01-02 NOTE — ED Provider Notes (Signed)
CSN: [id number]     Arrival date & time 01/02/15  1200 History   First MD Initiated Contact with Patient 01/02/15 1328     Chief Complaint  Patient presents with  . Shortness of Breath     (Consider location/radiation/quality/duration/timing/severity/associated sxs/prior Treatment) HPI [name] is a 39 y.o. female with a history of DVT, lupus, comes in for evaluation of shortness of breath and chest discomfort. Patient reports over the past 2 weeks she has been having sharp pain with deep respiration on the left lower chest. Sometimes discomfort will radiate through to her back. She reports that she is supposed to take Coumadin, but has been out of this medication for the past 4 days. She denies any fevers, chills, cough or hemoptysis, unilateral leg swelling, recent travel or surgeries.  Past Medical History  Diagnosis Date  . DVT (deep vein thrombosis) in pregnancy   . Headache(784.0)   . STD (sexually transmitted disease)     Condyloma   Past Surgical History  Procedure Laterality Date  . Cesarean section    . Colposcopy     Family History  Problem Relation Age of Onset  . Hypertension Father   . Thyroid disease Mother   . Diabetes Paternal Grandmother   . Cancer Paternal Grandmother     Stomach and Lung cancer   Social History  Substance Use Topics  . Smoking status: Former Games developer  . Smokeless tobacco: Never Used  . Alcohol Use: No     Comment: occas   OB History    Gravida Para Term Preterm AB TAB SAB Ectopic Multiple Living   Review of Systems A 10 point review of systems was completed and was negative except for pertinent positives and negatives as mentioned in the history of present illness     Allergies  Review of patient's allergies indicates no known allergies.  Home Medications   Prior to Admission medications   Medication Sig Start Date End Date Taking? Authorizing Provider  Belimumab (BENLYSTA IV) Inject into the vein  every 30 (thirty) days.     Historical Provider, MD  buPROPion (WELLBUTRIN XL) 150 MG 24 hr tablet Take 300 mg by mouth daily. 03/27/14   Historical Provider, MD  diphenhydramine-acetaminophen (TYLENOL PM) 25-500 MG TABS Take 1 tablet by mouth at bedtime as needed. Pain and sleep    Historical Provider, MD  hydroxychloroquine (PLAQUENIL) 200 MG tablet Take 200 mg by mouth 2 (two) times daily.  11/26/10   Historical Provider, MD  nystatin-triamcinolone ointment (MYCOLOG) Apply 1 application topically 2 (two) times daily. 03/18/14   Harrington Challenger, NP  predniSONE (DELTASONE) 1 MG tablet Take 2 mg by mouth Daily. 12/13/10   Historical Provider, MD  warfarin (COUMADIN) 4 MG tablet TAKE 1AND1/2 TABLETS ( ) AT BEDTIME DAILY OR AS DIRECTED 08/13/14   Levert Feinstein, MD   BP 138/113 mmHg  Pulse 92  Temp(Src) 98.4 F (36.9 C) (Oral)  Resp 19  SpO2 99%  LMP 12/19/2014 Physical Exam  Constitutional: She is oriented to person, place, and time. She appears well-developed and well-nourished.  Obese Caucasian female  HENT:  Head: Normocephalic and atraumatic.  Mouth/Throat: Oropharynx is clear and moist.  Eyes: Conjunctivae are normal. Pupils are equal, round, and reactive to light. Right eye exhibits no discharge. Left eye exhibits no discharge. No scleral icterus.  Neck: Neck supple.  Cardiovascular: Normal rate, regular rhythm and normal  heart sounds.   Pulmonary/Chest: Effort normal and breath sounds normal. No respiratory distress. She has no wheezes. She has no rales.  Abdominal: Soft. There is no tenderness.  Musculoskeletal: She exhibits no edema or tenderness.  No appreciable unilateral leg swelling, erythema or other evidence of thrombus. Muscle compartments are all soft. Distal pulses intact.  Neurological: She is alert and oriented to person, place, and time.  Cranial Nerves II-XII grossly intact  Skin: Skin is warm and dry. No rash noted.  Psychiatric: She has a normal mood and affect.   Nursing note and vitals reviewed.   ED Course  Procedures (including critical care time) Labs Review Labs Reviewed  CBC - Abnormal; Notable for the following:    Platelets 472 (*)    All other components within normal limits  BASIC METABOLIC PANEL  PROTIME-INR  Rosezena SensorI-STAT TROPOININ, ED    Imaging Review Dg Chest 2 View  01/02/2015  CLINICAL DATA:  Shortness of breath. EXAM: CHEST - 2 VIEW COMPARISON:  Two-view chest x-ray a 08/20/2011 FINDINGS: The heart size is normal. Lungs are clear. The visualized soft tissues and bony thorax are unremarkable. IMPRESSION: Negative two view chest x-ray Electronically Signed   By: Marin Robertshristopher  Mattern M.D.   On: 01/02/2015 13:25   I have personally reviewed and evaluated these images and lab results as part of my medical decision-making.   EKG Interpretation None     Meds given in ED:  Medications  enoxaparin (LOVENOX) injection 60 mg (60 mg Subcutaneous Given 01/02/15 1551)    Discharge Medication List as of 01/02/2015  3:04 PM     Filed Vitals:   01/02/15 1345 01/02/15 1430 01/02/15 1505 01/02/15 1519  BP: 111/86 100/53 93/56   Pulse: 89 82 83   Temp:   98.3 F (36.8 C) 98.8 F (37.1 C)  TempSrc:   Oral Oral  Resp: 14 21 17    SpO2: 100% 100% 99%     MDM  Kimberly MansonSarah W Andersen is a 39 y.o. female history of DVT on Coumadin therapy comes in for evaluation of pain with respiration. Has been off Coumadin therapy for the past 4 days. On arrival, she is hemodynamically stable with normal vital signs, afebrile. She has benign current pulmonary exam. No appreciable unilateral leg swelling. I feel her symptoms are more pleuritic in nature. Low suspicion for pneumonia, vital signs have been stable-no hypoxia or tachycardia. Her INR is subtherapeutic at 0.98. At this time, I do not feel she is to be scan for pulmonary embolus. She will need to be restarted on her anticoagulation. We'll bridge with Lovenox, given a dose in the ED and given a  prescription for the same. Instructed her to follow-up with her PCP tomorrow for medication reconciliation and INR check. Prior to patient discharge, discussed and reviewed this case with my attending, Dr. Dalene SeltzerSchlossman who agrees with above plan. Final diagnoses:  Pleuritic chest pain  Subtherapeutic international normalized ratio (INR)        Joycie PeekBenjamin Klein Willcox, PA-C 01/03/15 [id number]  Alvira MondayErin Schlossman, MD 01/08/15 1501

## 2015-01-02 NOTE — ED Notes (Signed)
Pt states for last 2 weeks has been having sharp pain with deep breath and sob.  Pt has history of pleural effusion and blood clots.  Pt states she has been off of blood thinners for one week. PT states she has been on coumadin

## 2015-01-03 ENCOUNTER — Telehealth: Payer: Self-pay | Admitting: *Deleted

## 2015-01-03 NOTE — Telephone Encounter (Signed)
Pt was seen in the ED 01/02/2015 for SOB/CP and was prescribed Lovenox 180 mg SQ Daily x 3 days until she can follow up with her  PCP. Hx DVT on Coumadin but none x 4 days with sub-therapeutic INR. Pt reports has called multiple pharmacies and has been unable to get medication filled and it is unavailable until Monday. Reviewed Case with Mayme GentaBen Cartner, PA-c as he is working in POD F today. He consulted with pharmacy and then authorized a new Prescription which CM called to Aurther Lofterry the Pharmacist at [facility]. Lovenox 120 mg SQ Bid x 3 Days No Refills.  Called pt to instruct her to A) Destroy original Rx,  B) Pick up new Rx, and C) see her PCP ASAP for follow up. Patient verbalized understanding of A-C. No further CM needs.

## 2015-01-05 ENCOUNTER — Ambulatory Visit (INDEPENDENT_AMBULATORY_CARE_PROVIDER_SITE_OTHER): Payer: BLUE CROSS/BLUE SHIELD | Admitting: Pharmacist

## 2015-01-05 DIAGNOSIS — R894 Abnormal immunological findings in specimens from other organs, systems and tissues: Secondary | ICD-10-CM | POA: Diagnosis not present

## 2015-01-05 DIAGNOSIS — R760 Raised antibody titer: Secondary | ICD-10-CM

## 2015-01-05 DIAGNOSIS — Z7901 Long term (current) use of anticoagulants: Secondary | ICD-10-CM

## 2015-01-05 LAB — POCT INR: INR: 1.2

## 2015-01-06 NOTE — Progress Notes (Signed)
Anti-Coagulation Progress Note  Kimberly MansonSarah W Andersen is a 39 y.o. female who is currently on an anti-coagulation regimen.    RECENT RESULTS: Recent results are below, the most recent result is correlated with a dose of 6mg  day since discharge (day after Thanksgiving) and she was on concomitant LMWH 1mg /kg SQ q12h she states. She is advised in setting of INR 1.2 to CONTINUE Lovenox bridge therapy with 1mg /kg SQ q12h (additional syringes were provided to her); take warfarin 8mg  for 3 consecutive days, followed by 6mg  warfarin daily until she has her INR performed by Lab on Monday January 12, 2015. Though I will be out of town at [employer], patient is aware and understands that Lab will call me results that I will react to and subsequently call the patient to adjust warfarin dose as necessary. Patient verbalized understanding of these instructions on teach-back.  Lab Results  Component Value Date   INR 1.20 01/05/2015   INR 0.98 01/02/2015   INR 2.08 10/27/2014   PROTIME 34.8* 01/23/2013    ANTI-COAG DOSE: Anticoagulation Dose Instructions as of 01/05/2015      Glynis SmilesSun Mon Tue Wed Thu Fri Sat   New Dose 6 mg 6 mg 6 mg 6 mg 6 mg 6 mg 6 mg    Description        Continue self injection of LMWH enoxaparin SQ q12h (1mg /kg). Additional syringes were provided to patient. Take warfarin concomitantly with the LMWH using warfarin dosing instructions provided and documented. Bridge therapy CONTINUES.        ANTICOAG SUMMARY: Anticoagulation Episode Summary    Current INR goal 2.0-3.0  Next INR check 01/12/2015  INR from last check 1.20! 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       Anticoagulation Care Providers    Provider Role Specialty Phone number   Levert FeinsteinJames M Granfortuna, MD Referring Oncology [phone]      ANTICOAG TODAY: Anticoagulation Summary as of 01/05/2015    INR goal 2.0-3.0  Selected INR 1.20! 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    Anticoagulation Care Providers    Provider Role Specialty Phone number   Levert FeinsteinJames M Granfortuna, MD Referring Oncology [phone]      PATIENT INSTRUCTIONS: Patient Instructions  Patient instructed to take medications as defined in the Anti-coagulation Track section of this encounter.  Patient instructed to TAKE today's dose.  Patient instructed to take warfarin as follows--using her 4mg  strength warfarin tablets:  2x4mg  tablets (8mg ) for 3 consecutive days; then decrease to 1&1/2 x 4mg  (6mg ) until her next INR scheduled for 5-DEC-16.  Patient instructed to CONTINUE LMWH enoxaparin/Lovenox with syringes provided (1mg /kg) injected subcutaneously TWICE daily--2 inches off of the umbilicus at level of waist band.  Patient verbalized understanding of these instructions.  FOLLOW-UP Return in 7 days (on 01/12/2015) for LAB ONLY Chip Boer aware and will CALL RESULTS TO ME. Hulen Luster, III Pharm.D., CACP

## 2015-01-06 NOTE — Progress Notes (Signed)
Reviewed and agree Thanks Dr G 

## 2015-01-06 NOTE — Patient Instructions (Signed)
Patient instructed to take medications as defined in the Anti-coagulation Track section of this encounter.  Patient instructed to TAKE today's dose.  Patient instructed to take warfarin as follows--using her 4mg  strength warfarin tablets:  2x4mg  tablets (8mg ) for 3 consecutive days; then decrease to 1&1/2 x 4mg  (6mg ) until her next INR scheduled for 5-DEC-16.  Patient instructed to CONTINUE LMWH enoxaparin/Lovenox with syringes provided (1mg /kg) injected subcutaneously TWICE daily--2 inches off of the umbilicus at level of waist band.  Patient verbalized understanding of these instructions.

## 2015-01-12 ENCOUNTER — Other Ambulatory Visit: Payer: BLUE CROSS/BLUE SHIELD

## 2015-01-13 ENCOUNTER — Telehealth: Payer: Self-pay | Admitting: Pharmacist

## 2015-01-13 DIAGNOSIS — Z7901 Long term (current) use of anticoagulants: Secondary | ICD-10-CM

## 2015-01-13 NOTE — Telephone Encounter (Signed)
Patient phoned with results of INR performed 5-DEC-16 = 2.4 on 42mg /wk warfarin + Lovenox 1mg /kgSQ q12h bridge therapy. INR > 2.0 will d/c bridge therapy and increase weekly dose to 50mg ./wk next INR 01-19-15. Patient has 4mg  strength warfarin tablets; will take 2x4mg  (8mg  dose) on Sundays, Tuesdays, Thursdays, Saturdays; will take 1&1/2 x 4mg  (6mg  dose) on Mondays, Wednesdays, Fridays.  She reports no signs or symptoms of bleeding or VTE and reads back appropriately my instructions to me on teach-back/read-back. She is discontinuing her LMWH injections as INR is now therapeutic.

## 2015-01-13 NOTE — Telephone Encounter (Signed)
Reviewed Thanks DrG 

## 2015-03-20 ENCOUNTER — Encounter: Payer: BLUE CROSS/BLUE SHIELD | Admitting: Women's Health

## 2015-06-05 ENCOUNTER — Other Ambulatory Visit: Payer: Self-pay | Admitting: Oncology

## 2015-06-05 NOTE — Telephone Encounter (Signed)
Saw Dr Alexandria LodgeGroce 01/05/15.

## 2016-05-21 ENCOUNTER — Other Ambulatory Visit: Payer: Self-pay | Admitting: Internal Medicine

## 2016-05-23 NOTE — Telephone Encounter (Signed)
Rx placed on hold at this time.  Call made to patient to see if she's getting her INR checked elsewhere, last visit 12/26/2014 with Dr Alexandria Lodge ([facility]).  No answer-HIPPA compliant message left on recorder.  Will await call back from pt.Criss Alvine, Darlene Cassady4/16/201810:43 AM

## 2016-05-30 NOTE — Telephone Encounter (Signed)
Call from patient-she has been out of town and just now returning my call.  Pt has not been taking warfarin for about 2wks now and is currently not being followed by the coumadin clinic.  Pt states she was confused about where to go and once Dr. Cyndie Chime relocated to [facility]. Importance of not missing warfarin doses and coumadin clinic appts stressed to patient.  I will send refill request to Dr.Granfortuna and Dr Alexandria Lodge for review.  I will also need to know how long after restarting warfarin should pt come back for INR? Please advise.Kingsley Spittle Cassady4/23/201811:19 AM

## 2016-06-01 ENCOUNTER — Emergency Department (HOSPITAL_BASED_OUTPATIENT_CLINIC_OR_DEPARTMENT_OTHER): Payer: BLUE CROSS/BLUE SHIELD

## 2016-06-01 ENCOUNTER — Emergency Department (HOSPITAL_BASED_OUTPATIENT_CLINIC_OR_DEPARTMENT_OTHER)
Admission: EM | Admit: 2016-06-01 | Discharge: 2016-06-01 | Disposition: A | Discharge status: Home / Self Care | Payer: BLUE CROSS/BLUE SHIELD | Attending: Emergency Medicine | Admitting: Emergency Medicine

## 2016-06-01 ENCOUNTER — Encounter (HOSPITAL_BASED_OUTPATIENT_CLINIC_OR_DEPARTMENT_OTHER): Payer: Self-pay | Admitting: Emergency Medicine

## 2016-06-01 DIAGNOSIS — R0789 Other chest pain: Secondary | ICD-10-CM | POA: Insufficient documentation

## 2016-06-01 DIAGNOSIS — I82432 Acute embolism and thrombosis of left popliteal vein: Secondary | ICD-10-CM | POA: Diagnosis not present

## 2016-06-01 DIAGNOSIS — Z87891 Personal history of nicotine dependence: Secondary | ICD-10-CM | POA: Insufficient documentation

## 2016-06-01 DIAGNOSIS — Z7901 Long term (current) use of anticoagulants: Secondary | ICD-10-CM | POA: Diagnosis not present

## 2016-06-01 DIAGNOSIS — R0602 Shortness of breath: Secondary | ICD-10-CM | POA: Diagnosis present

## 2016-06-01 LAB — CBC WITH DIFFERENTIAL/PLATELET
BASOS ABS: 0 10*3/uL (ref 0.0–0.1)
BASOS PCT: 0 %
EOS PCT: 3 %
Eosinophils Absolute: 0.4 10*3/uL (ref 0.0–0.7)
HCT: 40.5 % (ref 36.0–46.0)
Hemoglobin: 13.6 g/dL (ref 12.0–15.0)
LYMPHS ABS: 1.8 10*3/uL (ref 0.7–4.0)
Lymphocytes Relative: 15 %
MCH: 30.3 pg (ref 26.0–34.0)
MCHC: 33.6 g/dL (ref 30.0–36.0)
MCV: 90.2 fL (ref 78.0–100.0)
Monocytes Absolute: 1.2 10*3/uL — ABNORMAL HIGH (ref 0.1–1.0)
Monocytes Relative: 10 %
NEUTROS PCT: 72 %
Neutro Abs: 8.9 10*3/uL — ABNORMAL HIGH (ref 1.7–7.7)
PLATELETS: 492 10*3/uL — AB (ref 150–400)
RBC: 4.49 MIL/uL (ref 3.87–5.11)
RDW: 14.5 % (ref 11.5–15.5)
WBC: 12.4 10*3/uL — AB (ref 4.0–10.5)

## 2016-06-01 LAB — PROTIME-INR
INR: 1.61
PROTHROMBIN TIME: 19.3 s — AB (ref 11.4–15.2)

## 2016-06-01 LAB — BASIC METABOLIC PANEL
ANION GAP: 9 (ref 5–15)
BUN: 9 mg/dL (ref 6–20)
CHLORIDE: 100 mmol/L — AB (ref 101–111)
CO2: 28 mmol/L (ref 22–32)
Calcium: 9.3 mg/dL (ref 8.9–10.3)
Creatinine, Ser: 0.87 mg/dL (ref 0.44–1.00)
Glucose, Bld: 94 mg/dL (ref 65–99)
Potassium: 3.6 mmol/L (ref 3.5–5.1)
SODIUM: 137 mmol/L (ref 135–145)

## 2016-06-01 LAB — TROPONIN I

## 2016-06-01 MED ORDER — IOPAMIDOL (ISOVUE-370) INJECTION 76%
100.0000 mL | Freq: Once | INTRAVENOUS | Status: AC | PRN
Start: 2016-06-01 — End: 2016-06-01
  Administered 2016-06-01: 100 mL via INTRAVENOUS

## 2016-06-01 NOTE — Discharge Instructions (Signed)
Continue your coumadin.  Follow up with your PCP.

## 2016-06-01 NOTE — ED Notes (Signed)
Patient transported to X-ray 

## 2016-06-01 NOTE — ED Notes (Signed)
Patient transported to CT 

## 2016-06-01 NOTE — ED Triage Notes (Signed)
PT states she was seen at [facility] where she got an [place] and they dx her with blood clot

## 2016-06-01 NOTE — ED Provider Notes (Signed)
[facility]   CSN: [id number] Arrival date & time: 06/01/16  1615     History   Chief Complaint Chief Complaint  Patient presents with  . Shortness of Breath    HPI [name] is a 41 y.o. female.  41 yo F with a cc of sob.  Going on for past couple of days. Hx of PE on coumadin, though had been without her prescription for two weeks.  Just started back last week.  The patient has left leg swelling and pain as well. Went to her family physician's office was found to have a extensive DVT to the left lower extremity. Sent here for chest pain shortness of breath.   The history is provided by the patient.  Shortness of Breath  This is a new problem. The problem occurs rarely.The current episode started less than 1 hour ago. The problem has not changed since onset.Associated symptoms include chest pain. Pertinent negatives include no fever, no headaches, no rhinorrhea, no wheezing and no vomiting. She has tried nothing for the symptoms. The treatment provided no relief.    Past Medical History:  Diagnosis Date  . DVT (deep vein thrombosis) in pregnancy (HCC)   . Headache(784.0)   . STD (sexually transmitted disease)    Condyloma    Patient Active Problem List   Diagnosis Date Noted  . CAP (community acquired pneumonia) 01/02/2015  . Morbid obesity (HCC) 04/05/2012  . Fibromyalgia muscle pain 03/14/2011  . Headache(784.0)   . STD (sexually transmitted disease)   . Chronic anticoagulation 12/14/2010  . 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 CELLULITIS, RIGHT LEG 05/19/2008  . PERICARDIAL EFFUSION 10/29/2007  . SYSTEMIC LUPUS ERYTHEMATOSUS 10/29/2007  . LEG EDEMA, LEFT 10/29/2007  . DEEP VENOUS THROMBOPHLEBITIS, HX OF 10/29/2007    Past Surgical History:  Procedure Laterality Date  . CESAREAN SECTION    . COLPOSCOPY      OB History    Gravida Para Term Preterm AB Living   SAB TAB Ectopic Multiple Live Births   1               Home Medications    Prior to Admission medications   Medication Sig Start Date End Date Taking? Authorizing Provider  Belimumab (BENLYSTA IV) Inject into the vein every 30 (thirty) days.     Historical Provider, MD  buPROPion (WELLBUTRIN XL) 150 MG 24 hr tablet Take 300 mg by mouth daily. 03/27/14   Historical Provider, MD  diphenhydramine-acetaminophen (TYLENOL PM) 25-500 MG TABS Take 1 tablet by mouth at bedtime as needed. Pain and sleep    Historical Provider, MD  enoxaparin (LOVENOX) 300 MG/3ML SOLN injection Inject 1.8 mLs (180 mg total) into the skin daily. 01/02/15   Joycie Peek, PA-C  hydroxychloroquine (PLAQUENIL) 200 MG tablet Take 200 mg by mouth 2 (two) times daily.  11/26/10   Historical Provider, MD  predniSONE (DELTASONE) 1 MG tablet Take 1 mg by mouth Daily.  12/13/10   Historical Provider, MD  warfarin (COUMADIN) 4 MG tablet TAKE 1 AND A 1/2 TABLETS BY MOUTH AT BEDTIME OR AS DIRECTED BY YOUR DOCTOR 05/30/16   Levert Feinstein, MD    Family History Family History  Problem Relation Age of Onset  . Hypertension Father   . Thyroid disease Mother   . Diabetes Paternal Grandmother   . Cancer Paternal Grandmother  Stomach and Lung cancer    Social History Social History  Substance Use Topics  . Smoking status: Former Games developer  . Smokeless tobacco: Never Used  . Alcohol use No     Comment: occas     Allergies   Patient has no known allergies.   Review of Systems Review of Systems  Constitutional: Negative for chills and fever.  HENT: Negative for congestion and rhinorrhea.   Eyes: Negative for redness and visual disturbance.  Respiratory: Positive for chest tightness and shortness of breath. Negative for wheezing.   Cardiovascular: Positive for chest pain. Negative for palpitations.  Gastrointestinal: Negative for nausea and vomiting.  Genitourinary: Negative for dysuria and urgency.    Musculoskeletal: Positive for joint swelling. Negative for arthralgias and myalgias.  Skin: Negative for pallor and wound.  Neurological: Negative for dizziness and headaches.     Physical Exam Updated Vital Signs BP 126/69   Pulse 92   Temp 98.9 F (37.2 C) (Oral)   Resp 18   Ht 5' 5.5" (1.664 m)   Wt 267 lb (121.1 kg)   LMP 05/18/2016   SpO2 98%   BMI 43.76 kg/m   Physical Exam  Constitutional: She is oriented to person, place, and time. She appears well-developed and well-nourished. No distress.  HENT:  Head: Normocephalic and atraumatic.  Eyes: EOM are normal. Pupils are equal, round, and reactive to light.  Neck: Normal range of motion. Neck supple.  Cardiovascular: Normal rate and regular rhythm.  Exam reveals no gallop and no friction rub.   No murmur heard. Pulmonary/Chest: Effort normal. She has no wheezes. She has no rales.  Abdominal: Soft. She exhibits no distension and no mass. There is no tenderness. There is no guarding.  Musculoskeletal: She exhibits edema (LLE > R ). She exhibits no tenderness.  Neurological: She is alert and oriented to person, place, and time.  Skin: Skin is warm and dry. She is not diaphoretic.  Psychiatric: She has a normal mood and affect. Her behavior is normal.  Nursing note and vitals reviewed.    ED Treatments / Results  Labs (all labs ordered are listed, but only abnormal results are displayed) Labs Reviewed  PROTIME-INR - Abnormal; Notable for the following:       Result Value   Prothrombin Time 19.3 (*)    All other components within normal limits  CBC WITH DIFFERENTIAL/PLATELET - Abnormal; Notable for the following:    WBC 12.4 (*)    Platelets 492 (*)    Neutro Abs 8.9 (*)    Monocytes Absolute 1.2 (*)    All other components within normal limits  BASIC METABOLIC PANEL - Abnormal; Notable for the following:    Chloride 100 (*)    All other components within normal limits  TROPONIN I    EKG  EKG  Interpretation  Date/Time:  Wednesday June 01 2016 16:29:54 EDT Ventricular Rate:  95 PR Interval:    QRS Duration: 84 QT Interval:  341 QTC Calculation: 429 R Axis:   91 Text Interpretation:  Sinus rhythm Borderline right axis deviation No significant change since last tracing Confirmed by Taliesin Hartlage MD, DANIEL [phone]) on 06/01/2016 4:37:40 PM       Radiology Dg Chest 2 View  Result Date: 06/01/2016 CLINICAL DATA:  RN drawing blood. PT diagnosed with LE DVT on Ultrasound at [facility] x today. Pt also has SOB while walking. HX: DVT during pregnancy, Former smoker. Shielded EXAM: CHEST  2 VIEW COMPARISON:  None.  FINDINGS: Normal mediastinum and cardiac silhouette. Normal pulmonary vasculature. No evidence of effusion, infiltrate, or pneumothorax. No acute bony abnormality. IMPRESSION: No acute cardiopulmonary process. Electronically Signed   By: Genevive Bi M.D.   On: 06/01/2016 17:02   Ct Angio Chest Pe W And/or Wo Contrast  Result Date: 06/01/2016 CLINICAL DATA:  Chest pain and shortness of Breath EXAM: CT ANGIOGRAPHY CHEST WITH CONTRAST TECHNIQUE: Multidetector CT imaging of the chest was performed using the standard protocol during bolus administration of intravenous contrast. Multiplanar CT image reconstructions and MIPs were obtained to evaluate the vascular anatomy. CONTRAST:  100 mL Isovue 370. COMPARISON:  Chest x-ray from earlier in the same day. FINDINGS: Cardiovascular: Thoracic aorta is well visualized and demonstrates no evidence of aneurysmal dilatation or dissection. Some motion artifact is noted. The pulmonary artery is well visualized and demonstrates a normal branching pattern. Mediastinum/Nodes: The thoracic inlet is within normal limits. No significant hilar or mediastinal adenopathy is noted. The esophagus as visualized is within normal limits. Lungs/Pleura: Lungs are clear. No pleural effusion or pneumothorax. Upper Abdomen: Within normal limits.  Musculoskeletal: No chest wall abnormality. No acute or significant osseous findings. Review of the MIP images confirms the above findings. IMPRESSION: No evidence of pulmonary emboli. No acute abnormality noted. Electronically Signed   By: Alcide Clever M.D.   On: 06/01/2016 18:02    Procedures Procedures (including critical care time)  Medications Ordered in ED Medications  iopamidol (ISOVUE-370) 76 % injection 100 mL (100 mLs Intravenous Contrast Given 06/01/16 1727)     Initial Impression / Assessment and Plan / ED Course  I have reviewed the triage vital signs and the nursing notes.  Pertinent labs & imaging results that were available during my care of the patient were reviewed by me and considered in my medical decision making (see chart for details).     41 yo F with a cc of sob. Going on for the past day or so. PCP was concerned for a PE as the patient has had these in the past and had a DVT diagnosed today. Had been off of her Coumadin as she had run out of her pills and was unsure to follow-up with. PE study negative. Will have her continue her Coumadin follow-up with her family Physician.   6:06 PM:  I have discussed the diagnosis/risks/treatment options with the patient and family and believe the pt to be eligible for discharge home to follow-up with PCP. We also discussed returning to the ED immediately if new or worsening sx occur. We discussed the sx which are most concerning (e.g., sudden worsening pain, fever, inability to tolerate by mouth) that necessitate immediate return. Medications administered to the patient during their visit and any new prescriptions provided to the patient are listed below.  Medications given during this visit Medications  iopamidol (ISOVUE-370) 76 % injection 100 mL (100 mLs Intravenous Contrast Given 06/01/16 1727)     The patient appears reasonably screen and/or stabilized for discharge and I doubt any other medical condition or other [facility]  requiring further screening, evaluation, or treatment in the ED at this time prior to discharge.    Final Clinical Impressions(s) / ED Diagnoses   Final diagnoses:  Acute deep vein thrombosis (DVT) of popliteal vein of left lower extremity [facility])    New Prescriptions New Prescriptions   No medications on file     Melene Plan, DO 06/01/16 1806

## 2016-12-11 ENCOUNTER — Emergency Department (HOSPITAL_BASED_OUTPATIENT_CLINIC_OR_DEPARTMENT_OTHER)
Admission: EM | Admit: 2016-12-11 | Discharge: 2016-12-11 | Disposition: A | Discharge status: Home / Self Care | Payer: BLUE CROSS/BLUE SHIELD | Attending: Emergency Medicine | Admitting: Emergency Medicine

## 2016-12-11 ENCOUNTER — Other Ambulatory Visit: Payer: Self-pay

## 2016-12-11 ENCOUNTER — Encounter (HOSPITAL_BASED_OUTPATIENT_CLINIC_OR_DEPARTMENT_OTHER): Payer: Self-pay | Admitting: *Deleted

## 2016-12-11 DIAGNOSIS — R2241 Localized swelling, mass and lump, right lower limb: Secondary | ICD-10-CM | POA: Insufficient documentation

## 2016-12-11 DIAGNOSIS — Z87891 Personal history of nicotine dependence: Secondary | ICD-10-CM | POA: Diagnosis not present

## 2016-12-11 DIAGNOSIS — M79604 Pain in right leg: Secondary | ICD-10-CM | POA: Diagnosis present

## 2016-12-11 DIAGNOSIS — Z79899 Other long term (current) drug therapy: Secondary | ICD-10-CM | POA: Diagnosis not present

## 2016-12-11 DIAGNOSIS — Z8739 Personal history of other diseases of the musculoskeletal system and connective tissue: Secondary | ICD-10-CM | POA: Diagnosis not present

## 2016-12-11 DIAGNOSIS — Z7901 Long term (current) use of anticoagulants: Secondary | ICD-10-CM | POA: Insufficient documentation

## 2016-12-11 DIAGNOSIS — Z86718 Personal history of other venous thrombosis and embolism: Secondary | ICD-10-CM | POA: Insufficient documentation

## 2016-12-11 HISTORY — DX: Reserved for concepts with insufficient information to code with codable children: IMO0002

## 2016-12-11 HISTORY — DX: Systemic lupus erythematosus, unspecified: M32.9

## 2016-12-11 LAB — CBC WITH DIFFERENTIAL/PLATELET
BASOS PCT: 0 %
Basophils Absolute: 0.1 10*3/uL (ref 0.0–0.1)
EOS ABS: 0.5 10*3/uL (ref 0.0–0.7)
Eosinophils Relative: 3 %
HEMATOCRIT: 36.5 % (ref 36.0–46.0)
Hemoglobin: 12.1 g/dL (ref 12.0–15.0)
LYMPHS ABS: 2.6 10*3/uL (ref 0.7–4.0)
Lymphocytes Relative: 17 %
MCH: 29.7 pg (ref 26.0–34.0)
MCHC: 33.2 g/dL (ref 30.0–36.0)
MCV: 89.5 fL (ref 78.0–100.0)
MONO ABS: 1.1 10*3/uL — AB (ref 0.1–1.0)
MONOS PCT: 8 %
NEUTROS ABS: 10.7 10*3/uL — AB (ref 1.7–7.7)
NEUTROS PCT: 72 %
Platelets: 676 10*3/uL — ABNORMAL HIGH (ref 150–400)
RBC: 4.08 MIL/uL (ref 3.87–5.11)
RDW: 14.6 % (ref 11.5–15.5)
WBC: 14.9 10*3/uL — ABNORMAL HIGH (ref 4.0–10.5)

## 2016-12-11 LAB — BASIC METABOLIC PANEL
Anion gap: 8 (ref 5–15)
BUN: 12 mg/dL (ref 6–20)
CALCIUM: 8.7 mg/dL — AB (ref 8.9–10.3)
CHLORIDE: 103 mmol/L (ref 101–111)
CO2: 26 mmol/L (ref 22–32)
CREATININE: 0.9 mg/dL (ref 0.44–1.00)
Glucose, Bld: 105 mg/dL — ABNORMAL HIGH (ref 65–99)
Potassium: 3.5 mmol/L (ref 3.5–5.1)
Sodium: 137 mmol/L (ref 135–145)

## 2016-12-11 LAB — PROTIME-INR
INR: 5.17
PROTHROMBIN TIME: 47.3 s — AB (ref 11.4–15.2)

## 2016-12-11 NOTE — ED Notes (Signed)
Primary RN and Dr Eudelia Bunchardama aware of elevated INR of 5.17

## 2016-12-11 NOTE — Discharge Instructions (Addendum)
Please return tomorrow morning at 10.30 am and tell the front desk here here for a scheduled ultrasound of your leg to assess for DVT.

## 2016-12-11 NOTE — ED Provider Notes (Signed)
[facility] EMERGENCY DEPARTMENT Provider Note  CSN: [id number] Arrival date & time: 12/11/16 2101  Chief Complaint(s) Knee Pain  HPI [name] is a 41 y.o. female with a history of lupus, DVTs on Coumadin who presents to the emergency department with right leg pain for several days with increased swelling.  Patient reports "tweaking her right knee" several days ago and thinking that this was the cause of her pain however her pain has migrated down to her right calf.  Pain is exacerbated with ambulation and palpation of the right calf.  She denies any falls or trauma.  She does report having DVTs while on other anticoagulation.  She was previously in a research study at [facility] until several months ago where she was receiving Coumadin and another medication infusion.  She was getting her INR checked during those visits however since her insurance company stopped paying for the medication, the patient stopped going to the visits.  The last time she had her INR checked was several months ago.  Patient denies any chest pain, new shortness of breath.  Denies any other physical complaints.  The history is provided by the patient.    Past Medical History Past Medical History:  Diagnosis Date  . DVT (deep vein thrombosis) in pregnancy (HCC)   . Headache(784.0)   . Lupus   . STD (sexually transmitted disease)    Condyloma   Patient Active Problem List   Diagnosis Date Noted  . CAP (community acquired pneumonia) 01/02/2015  . Morbid obesity (HCC) 04/05/2012  . Fibromyalgia muscle pain 03/14/2011  . Headache(784.0)   . STD (sexually transmitted disease)   . Chronic anticoagulation 12/14/2010  . 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 CELLULITIS, RIGHT LEG 05/19/2008  . PERICARDIAL EFFUSION 10/29/2007  . SYSTEMIC LUPUS ERYTHEMATOSUS 10/29/2007  . LEG EDEMA, LEFT 10/29/2007  . DEEP VENOUS THROMBOPHLEBITIS, HX OF  10/29/2007   Home Medication(s) Prior to Admission medications   Medication Sig Start Date End Date Taking? Authorizing Provider  Belimumab (BENLYSTA IV) Inject into the vein every 30 (thirty) days.     [provider]  buPROPion (WELLBUTRIN XL) 150 MG 24 hr tablet Take 300 mg by mouth daily. 03/27/14   [provider]  diphenhydramine-acetaminophen (TYLENOL PM) 25-500 MG TABS Take 1 tablet by mouth at bedtime as needed. Pain and sleep    [provider]  enoxaparin (LOVENOX) 300 MG/3ML SOLN injection Inject 1.8 mLs (180 mg total) into the skin daily. 01/02/15   Cartner, Sharlet Salina, PA-C  hydroxychloroquine (PLAQUENIL) 200 MG tablet Take 200 mg by mouth 2 (two) times daily.  11/26/10   [provider]  predniSONE (DELTASONE) 1 MG tablet Take 1 mg by mouth Daily.  12/13/10   [provider]  warfarin (COUMADIN) 4 MG tablet TAKE 1 AND A 1/2 TABLETS BY MOUTH AT BEDTIME OR AS DIRECTED BY YOUR DOCTOR 05/30/16   Levert Feinstein, MD  Past Surgical History Past Surgical History:  Procedure Laterality Date  . CESAREAN SECTION    . COLPOSCOPY     Family History Family History  Problem Relation Age of Onset  . Hypertension Father   . Thyroid disease Mother   . Diabetes Paternal Grandmother   . Cancer Paternal Grandmother        Stomach and Lung cancer    Social History Social History   Tobacco Use  . Smoking status: Former Games developermoker  . Smokeless tobacco: Never Used  Substance Use Topics  . Alcohol use: No    Alcohol/week: 0.0 oz    Comment: occas  . Drug use: No   Allergies Patient has no known allergies.  Review of Systems Review of Systems All other systems are reviewed and are negative for acute change except as noted in the HPI  Physical Exam Vital Signs  I have reviewed the triage vital signs BP 130/76  (BP Location: Right Arm)   Pulse 94   Temp 98.3 F (36.8 C) (Oral)   Resp 18   Ht 5\' 5"  (1.651 m)   Wt 121.6 kg (268 lb)   LMP 12/04/2016   SpO2 99%   BMI 44.60 kg/m   Physical Exam  Constitutional: She is oriented to person, place, and time. She appears well-developed and well-nourished. No distress.  HENT:  Head: Normocephalic and atraumatic.  Right Ear: External ear normal.  Left Ear: External ear normal.  Nose: Nose normal.  Eyes: Conjunctivae and EOM are normal. No scleral icterus.  Neck: Normal range of motion and phonation normal.  Cardiovascular: Normal rate and regular rhythm.  Pulmonary/Chest: Effort normal. No stridor. No respiratory distress.  Abdominal: She exhibits no distension.  Musculoskeletal: Normal range of motion. She exhibits no edema.       Right lower leg: She exhibits tenderness and swelling (larger when compared to LLE). She exhibits no bony tenderness and no edema.  Neurological: She is alert and oriented to person, place, and time.  Skin: She is not diaphoretic.  Psychiatric: She has a normal mood and affect. Her behavior is normal.  Vitals reviewed.   ED Results and Treatments Labs (all labs ordered are listed, but only abnormal results are displayed) Labs Reviewed  CBC WITH DIFFERENTIAL/PLATELET - Abnormal; Notable for the following components:      Result Value   WBC 14.9 (*)    Platelets 676 (*)    Neutro Abs 10.7 (*)    Monocytes Absolute 1.1 (*)    All other components within normal limits  BASIC METABOLIC PANEL - Abnormal; Notable for the following components:   Glucose, Bld 105 (*)    Calcium 8.7 (*)    All other components within normal limits  PROTIME-INR - Abnormal; Notable for the following components:   Prothrombin Time 47.3 (*)    INR 5.17 (*)    All other components within normal limits  EKG  EKG  Interpretation  Date/Time:    Ventricular Rate:    PR Interval:    QRS Duration:   QT Interval:    QTC Calculation:   R Axis:     Text Interpretation:        Radiology No results found. Pertinent labs & imaging results that were available during my care of the patient were reviewed by me and considered in my medical decision making (see chart for details).  Medications Ordered in ED Medications - No data to display                                                                                                                                  Procedures Procedures  (including critical care time)  Medical Decision Making / ED Course I have reviewed the nursing notes for this encounter and the patient's prior records (if available in EHR or on provided paperwork).    Right lower extremity pain and swelling.  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  Unfortunately at this time of night we do not have ultrasound available.  Screening labs were obtained which revealed a supratherapeutic INR.  Outpatient ultrasound was ordered for the patient. The patient is safe for discharge with strict return precautions.   Final Clinical Impression(s) / ED Diagnoses Final diagnoses:  Right leg pain   Disposition: Discharge  Condition: Good  I have discussed the results, Dx and Tx plan with the patient who expressed understanding and agree(s) with the plan. Discharge instructions discussed at great length. The patient was given strict return precautions who verbalized understanding of the instructions. No further questions at time of discharge.    Follow Up: [facility]MENT [address] [id number] mc [place] [address] [phone] In 1 day at 10:30 am for ultrasound of right leg to asssess for DVT.      This chart was dictated using voice  recognition software.  Despite best efforts to proofread,  errors can occur which can change the documentation meaning.   Nira Conn, MD 12/11/16 2342

## 2016-12-11 NOTE — ED Triage Notes (Signed)
Pt reports since Thursday she has been having some pain in the right knee. Denies injury. Now she is having swelling in the knee and calf area with bruising and warmth. Hx of DVT, pt is on coumadin 6mg /day. Shortness of breath with walking

## 2016-12-12 ENCOUNTER — Ambulatory Visit (HOSPITAL_BASED_OUTPATIENT_CLINIC_OR_DEPARTMENT_OTHER)
Admission: RE | Admit: 2016-12-12 | Discharge: 2016-12-12 | Disposition: A | Discharge status: Home / Self Care | Payer: BLUE CROSS/BLUE SHIELD | Source: Ambulatory Visit | Attending: Emergency Medicine | Admitting: Emergency Medicine

## 2016-12-12 ENCOUNTER — Other Ambulatory Visit (HOSPITAL_BASED_OUTPATIENT_CLINIC_OR_DEPARTMENT_OTHER): Payer: BLUE CROSS/BLUE SHIELD

## 2016-12-12 DIAGNOSIS — M79604 Pain in right leg: Secondary | ICD-10-CM | POA: Diagnosis not present

## 2016-12-23 ENCOUNTER — Other Ambulatory Visit: Payer: Self-pay | Admitting: Orthopedic Surgery

## 2016-12-23 DIAGNOSIS — M25561 Pain in right knee: Secondary | ICD-10-CM

## 2017-01-03 ENCOUNTER — Encounter: Payer: BLUE CROSS/BLUE SHIELD | Admitting: Oncology

## 2017-05-22 ENCOUNTER — Other Ambulatory Visit: Payer: Self-pay | Admitting: Oncology

## 2017-05-22 NOTE — Telephone Encounter (Signed)
Called pt - no answer; left message she needs an appt.

## 2017-05-22 NOTE — Telephone Encounter (Signed)
Last refilled a year ago. Pt missed appt with Dr Cyndie ChimeGranfortuna in November.

## 2017-06-01 ENCOUNTER — Other Ambulatory Visit: Payer: Self-pay | Admitting: Family Medicine

## 2017-06-29 ENCOUNTER — Encounter: Payer: Self-pay | Admitting: Family Medicine

## 2017-07-12 ENCOUNTER — Other Ambulatory Visit: Payer: Self-pay | Admitting: Oncology

## 2017-08-08 ENCOUNTER — Other Ambulatory Visit: Payer: Self-pay | Admitting: Oncology

## 2017-08-08 NOTE — Telephone Encounter (Signed)
missed her appt in Nov.

## 2018-06-02 ENCOUNTER — Emergency Department (HOSPITAL_COMMUNITY)
Admission: EM | Admit: 2018-06-02 | Discharge: 2018-06-02 | Disposition: A | Discharge status: Home / Self Care | Payer: BLUE CROSS/BLUE SHIELD | Attending: Emergency Medicine | Admitting: Emergency Medicine

## 2018-06-02 ENCOUNTER — Emergency Department (HOSPITAL_COMMUNITY): Payer: BLUE CROSS/BLUE SHIELD

## 2018-06-02 ENCOUNTER — Other Ambulatory Visit: Payer: Self-pay

## 2018-06-02 DIAGNOSIS — R109 Unspecified abdominal pain: Secondary | ICD-10-CM

## 2018-06-02 DIAGNOSIS — R1031 Right lower quadrant pain: Secondary | ICD-10-CM | POA: Diagnosis present

## 2018-06-02 DIAGNOSIS — Z7901 Long term (current) use of anticoagulants: Secondary | ICD-10-CM | POA: Insufficient documentation

## 2018-06-02 DIAGNOSIS — R791 Abnormal coagulation profile: Secondary | ICD-10-CM | POA: Insufficient documentation

## 2018-06-02 DIAGNOSIS — R110 Nausea: Secondary | ICD-10-CM

## 2018-06-02 DIAGNOSIS — Z87891 Personal history of nicotine dependence: Secondary | ICD-10-CM | POA: Insufficient documentation

## 2018-06-02 DIAGNOSIS — N1330 Unspecified hydronephrosis: Secondary | ICD-10-CM | POA: Insufficient documentation

## 2018-06-02 DIAGNOSIS — R3129 Other microscopic hematuria: Secondary | ICD-10-CM | POA: Diagnosis not present

## 2018-06-02 DIAGNOSIS — R102 Pelvic and perineal pain: Secondary | ICD-10-CM | POA: Insufficient documentation

## 2018-06-02 DIAGNOSIS — Z79899 Other long term (current) drug therapy: Secondary | ICD-10-CM | POA: Diagnosis not present

## 2018-06-02 DIAGNOSIS — N134 Hydroureter: Secondary | ICD-10-CM | POA: Insufficient documentation

## 2018-06-02 DIAGNOSIS — N23 Unspecified renal colic: Secondary | ICD-10-CM | POA: Diagnosis not present

## 2018-06-02 LAB — COMPREHENSIVE METABOLIC PANEL
ALT: 19 U/L (ref 0–44)
AST: 20 U/L (ref 15–41)
Albumin: 3.8 g/dL (ref 3.5–5.0)
Alkaline Phosphatase: 94 U/L (ref 38–126)
Anion gap: 8 (ref 5–15)
BUN: 11 mg/dL (ref 6–20)
CO2: 24 mmol/L (ref 22–32)
Calcium: 8.8 mg/dL — ABNORMAL LOW (ref 8.9–10.3)
Chloride: 104 mmol/L (ref 98–111)
Creatinine, Ser: 0.93 mg/dL (ref 0.44–1.00)
GFR calc Af Amer: >60 mL/min (ref >60)
GFR calc non Af Amer: >60 mL/min (ref >60)
Glucose, Bld: 102 mg/dL — ABNORMAL HIGH (ref 70–99)
Potassium: 3.7 mmol/L (ref 3.5–5.1)
Sodium: 136 mmol/L (ref 135–145)
Total Bilirubin: 0.7 mg/dL (ref 0.3–1.2)
Total Protein: 8.3 g/dL — ABNORMAL HIGH (ref 6.5–8.1)

## 2018-06-02 LAB — CBC WITH DIFFERENTIAL/PLATELET
Abs Immature Granulocytes: 0.03 10*3/uL (ref 0.00–0.07)
Basophils Absolute: 0.1 10*3/uL (ref 0.0–0.1)
Basophils Relative: 1 %
Eosinophils Absolute: 0.5 10*3/uL (ref 0.0–0.5)
Eosinophils Relative: 4 %
HCT: 41.3 % (ref 36.0–46.0)
Hemoglobin: 13.4 g/dL (ref 12.0–15.0)
Immature Granulocytes: 0 %
Lymphocytes Relative: 17 %
Lymphs Abs: 2.1 10*3/uL (ref 0.7–4.0)
MCH: 29.9 pg (ref 26.0–34.0)
MCHC: 32.4 g/dL (ref 30.0–36.0)
MCV: 92.2 fL (ref 80.0–100.0)
Monocytes Absolute: 1.2 10*3/uL — ABNORMAL HIGH (ref 0.1–1.0)
Monocytes Relative: 10 %
Neutro Abs: 8.2 10*3/uL — ABNORMAL HIGH (ref 1.7–7.7)
Neutrophils Relative %: 68 %
Platelets: 718 10*3/uL — ABNORMAL HIGH (ref 150–400)
RBC: 4.48 MIL/uL (ref 3.87–5.11)
RDW: 13.8 % (ref 11.5–15.5)
WBC: 12 10*3/uL — ABNORMAL HIGH (ref 4.0–10.5)
nRBC: 0 % (ref 0.0–0.2)

## 2018-06-02 LAB — URINALYSIS, ROUTINE W REFLEX MICROSCOPIC
Bacteria, UA: NONE SEEN
Bilirubin Urine: NEGATIVE
Glucose, UA: NEGATIVE mg/dL
Ketones, ur: NEGATIVE mg/dL
Leukocytes,Ua: NEGATIVE
Nitrite: NEGATIVE
Protein, ur: NEGATIVE mg/dL
Specific Gravity, Urine: 1.011 (ref 1.005–1.030)
pH: 6 (ref 5.0–8.0)

## 2018-06-02 LAB — I-STAT BETA HCG BLOOD, ED (MC, WL, AP ONLY): I-stat hCG, quantitative: <5 m[IU]/mL (ref <5)

## 2018-06-02 LAB — PROTIME-INR
INR: 1.5 — ABNORMAL HIGH (ref 0.8–1.2)
Prothrombin Time: 17.7 seconds — ABNORMAL HIGH (ref 11.4–15.2)

## 2018-06-02 MED ORDER — HYDROCODONE-ACETAMINOPHEN 5-325 MG PO TABS: 1.0000 | ORAL_TABLET | Freq: Four times a day (QID) | ORAL | 0 refills | Status: AC | PRN

## 2018-06-02 MED ORDER — ONDANSETRON HCL 4 MG/2ML IJ SOLN
4.0000 mg | Freq: Once | INTRAMUSCULAR | Status: AC
  Administered 2018-06-02: 15:00:00 4 mg via INTRAVENOUS
  Filled 2018-06-02: qty 2

## 2018-06-02 MED ORDER — ONDANSETRON 4 MG PO TBDP: 4.0000 mg | ORAL_TABLET | Freq: Three times a day (TID) | ORAL | 0 refills | Status: AC | PRN

## 2018-06-02 MED ORDER — MORPHINE SULFATE (PF) 4 MG/ML IV SOLN
4.0000 mg | Freq: Once | INTRAVENOUS | Status: AC
  Administered 2018-06-02: 4 mg via INTRAVENOUS
  Filled 2018-06-02: qty 1

## 2018-06-02 NOTE — ED Provider Notes (Signed)
[facility]-EMERGENCY DEPT Provider Note   CSN: [id number] Arrival date & time: 06/02/18  1356    History   Chief Complaint Chief Complaint  Patient presents with   Flank Pain    Right    HPI    [name] is a 43 y.o. 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  Patient states that yesterday she had some hematuria, she was seen by her PCP and her INR was checked and was 2.6.  The hematuria since resolved however she suddenly developed right flank pain about an hour and a half ago so she decided to come here.  She has never had a kidney stone.  She describes her pain as 10/10 constant pressure and stabbing pain in the right flank radiating to the RLQ, with no known aggravating or alleviating factors, no treatments tried prior to arrival.  She reports that her hematuria has since resolved.  She endorses associated nausea, increased urinary frequency and urgency, and 2 episodes of nonbloody diarrhea just prior to arrival.  She has not had any further diarrhea.  She had a C-section 13 years ago but has not had any other prior abdominal surgeries.  No known family history of kidney stones that she is aware of.  She denies any fevers, chills, chest pain, shortness of breath, vomiting, constipation, melena, hematochezia, dysuria, malodorous urine, vaginal bleeding or discharge, numbness, tingling, focal weakness, or any other complaints at this time. She denies recent travel, sick contacts, suspicious food intake, alcohol use, or NSAID use.  The history is provided by the patient and medical records. No language interpreter was used.  Flank Pain  Associated symptoms include abdominal pain. Pertinent negatives include no chest pain and no  shortness of breath.    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 Authorizing Provider  Belimumab (BENLYSTA IV) Inject into the vein every 30 (thirty) days.  [provider]  buPROPion (WELLBUTRIN XL) 150 MG 24 hr tablet Take 300 mg by mouth daily. 03/27/14   [provider]  diphenhydramine-acetaminophen (TYLENOL PM) 25-500 MG TABS Take 1 tablet by mouth at bedtime as needed. Pain and sleep    [provider]  enoxaparin (LOVENOX) 300 MG/3ML SOLN injection Inject 1.8 mLs (180 mg total) into the skin daily. 01/02/15   Cartner, Sharlet Salina, PA-C  hydroxychloroquine (PLAQUENIL) 200 MG tablet Take 200 mg by mouth 2 (two) times daily.  11/26/10   [provider]  predniSONE (DELTASONE) 1 MG tablet Take 1 mg by mouth Daily.  12/13/10   [provider]  warfarin (COUMADIN) 4 MG tablet TAKE  1 AND 1/2 TABLETS BY MOUTH AT BEDTIME OR AS DIRECTED BY YOUR DOCTOR 08/08/17   Levert Feinstein, MD    Family History Family History  Problem Relation Age of Onset   Hypertension Father    Thyroid disease Mother    Diabetes Paternal Grandmother    Cancer Paternal Grandmother        Stomach and Lung cancer    Social History Social History   Tobacco Use   Smoking status: Former Smoker   Smokeless tobacco: Never Used  Substance Use Topics   Alcohol use: No    Alcohol/week: 0.0 standard drinks    Comment: occas   Drug use: No     Allergies   Patient has no known allergies.   Review of Systems Review of Systems  Constitutional: Negative for chills and fever.  Respiratory: Negative for shortness of breath.   Cardiovascular: Negative for chest pain.  Gastrointestinal: Positive for abdominal pain, diarrhea and nausea. Negative for constipation and vomiting.  Genitourinary: Positive for flank pain, frequency, hematuria (now resolved) and urgency. Negative for dysuria, vaginal bleeding and vaginal discharge.  Musculoskeletal: Negative for arthralgias and myalgias.  Skin: Negative for color change.  Allergic/Immunologic: Negative for immunocompromised state.  Neurological: Negative for weakness and numbness.  Psychiatric/Behavioral: Negative for confusion.   All other systems reviewed and are negative for acute change except as noted in the HPI.    Physical Exam Updated Vital Signs BP (!) 149/98 (BP Location: Right Arm)    Pulse 80    Temp 98.4 F (36.9 C) (Oral)    Resp 18    Ht  (1.651 m)    Wt 115.7 kg    LMP 05/17/2018 (Approximate)    SpO2 99%    BMI 42.43 kg/m   Physical Exam Vitals signs and nursing note reviewed.  Constitutional:      General: She is in acute distress (appears uncomfortable).     Appearance: Normal appearance. She is well-developed. She is not toxic-appearing.     Comments: Afebrile, nontoxic, appears uncomfortable  HENT:      Head: Normocephalic and atraumatic.  Eyes:     General:        Right eye: No discharge.        Left eye: No discharge.     Conjunctiva/sclera: Conjunctivae normal.  Neck:     Musculoskeletal: Normal range of motion and neck supple.  Cardiovascular:     Rate and Rhythm: Normal rate and regular rhythm.     Pulses: Normal pulses.     Heart sounds: Normal heart sounds, S1 normal and S2 normal. No murmur. No friction rub. No gallop.   Pulmonary:     Effort: Pulmonary effort is normal. No respiratory distress.     Breath sounds: Normal breath  sounds. No decreased breath sounds, wheezing, rhonchi or rales.  Abdominal:     General: Bowel sounds are normal. There is no distension.     Palpations: Abdomen is soft. Abdomen is not rigid.     Tenderness: There is abdominal tenderness in the right lower quadrant. There is right CVA tenderness. There is no left CVA tenderness, guarding or rebound. Negative signs include Murphy's sign and McBurney's sign.     Comments: Soft, obese but nondistended, +BS throughout, with moderate RLQ TTP tracking towards the R flank area, with mild R CVA TTP, no r/g/r, no upper abd TTP, neg murphy's, no focal mcburney's point tenderness  Musculoskeletal: Normal range of motion.  Skin:    General: Skin is warm and dry.     Findings: No rash.  Neurological:     Mental Status: She is alert and oriented to person, place, and time.     Sensory: Sensation is intact. No sensory deficit.     Motor: Motor function is intact.  Psychiatric:        Mood and Affect: Mood and affect normal.        Behavior: Behavior normal.      ED Treatments / Results  Labs (all labs ordered are listed, but only abnormal results are displayed) Labs Reviewed  CBC WITH DIFFERENTIAL/PLATELET - Abnormal; Notable for the following components:      Result Value   WBC 12.0 (*)    Platelets 718 (*)    Neutro Abs 8.2 (*)    Monocytes Absolute 1.2 (*)    All other components within normal limits   COMPREHENSIVE METABOLIC PANEL - Abnormal; Notable for the following components:   Glucose, Bld 102 (*)    Calcium 8.8 (*)    Total Protein 8.3 (*)    All other components within normal limits  URINALYSIS, ROUTINE W REFLEX MICROSCOPIC - Abnormal; Notable for the following components:   Color, Urine STRAW (*)    Hgb urine dipstick MODERATE (*)    All other components within normal limits  PROTIME-INR - Abnormal; Notable for the following components:   Prothrombin Time 17.7 (*)    INR 1.5 (*)    All other components within normal limits  I-STAT BETA HCG BLOOD, ED ([facility], [facility], [facility] ONLY)    EKG None  Radiology Ct Renal Stone Study  Result Date: 06/02/2018 CLINICAL DATA:  Right flank and right lower quadrant abdominal pain. EXAM: CT ABDOMEN AND PELVIS WITHOUT CONTRAST TECHNIQUE: Multidetector CT imaging of the abdomen and pelvis was performed following the standard protocol without IV contrast. COMPARISON:  None. FINDINGS: Lower chest: No acute abnormality. Hepatobiliary: No focal liver abnormality is seen. No gallstones, gallbladder wall thickening, or biliary dilatation. Pancreas: Unremarkable. No pancreatic ductal dilatation or surrounding inflammatory changes. 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 However, no metallic surgical clips are identified. Adrenals/Urinary Tract: No adrenal masses. There is evidence of mild right-sided hydronephrosis and hydroureter that can be followed all the way to the bladder. A right ureteral calculus is not identified and a calculus may already have been passed based on appearance. No intrarenal calculi are identified. The left kidney and ureter are normal. The bladder is unremarkable. Stomach/Bowel: Bowel shows no evidence of obstruction, ileus, inflammation or focal lesion. No free air identified. The appendix is normal. Vascular/Lymphatic: No significant vascular findings are present. No enlarged abdominal or pelvic lymph nodes.  Reproductive: Uterus and bilateral adnexa are unremarkable. Other: No abdominal wall hernia or abnormality. No abdominopelvic ascites.  Musculoskeletal: No acute or significant osseous findings. IMPRESSION: Mild right-sided hydronephrosis and hydroureter without visualized calculus. A calculus may already have been passed. Electronically Signed   By: Irish Lack M.D.   On: 06/02/2018 15:46    Procedures Procedures (including critical care time)  Medications Ordered in ED Medications  morphine 4 MG/ML injection 4 mg (4 mg Intravenous Given 06/02/18 1435)  ondansetron (ZOFRAN) injection 4 mg (4 mg Intravenous Given 06/02/18 1434)  morphine 4 MG/ML injection 4 mg (4 mg Intravenous Given 06/02/18 1630)     Initial Impression / Assessment and Plan / ED Course  I have reviewed the triage vital signs and the nursing notes.  Pertinent labs & imaging results that were available during my care of the patient were reviewed by me and considered in my medical decision making (see chart for details).        43 y.o. female here with R flank pain that began about an hour ago, had some hematuria yesterday but that resolved. Having urinary frequency and urgency today as well. On exam, appears uncomfortable, moderate TTP to RLQ tracking towards the R flank area, mild R CVA TTP, nonperitoneal, no upper abdominal tenderness with neg murphy's. Suspect kidney stone vs appendicitis, etc. Will get labs, U/A, and CT renal study. Will check INR as well, yesterday it was 2.6. Will give pain and nausea meds. Will reassess shortly.   4:22 PM CBC w/diff with mildly elevated WBC 12.0 and thrombocytosis plt 718 similar to prior visits. CMP essentially unremarkable. BetaHCG neg. U/A without evidence of UTI, moderate hematuria but no bacteria seen. INR pending. CT renal with mild right hydronephrosis and hydroureter without visualized calculus which could indicate passed calculus. Pt initially feeling a little better but  states pain meds have worn off. Suspect stone has passed and she's having residual hydro and ureteral spasm, less likely ureteral stricture; no indication this is UTI. Pt can't get toradol, will repeat morphine. Will reassess after that. For now, doubt need for urgent urologic consultation. Discussed case with my attending Dr. Judd Lien who agrees with plan.   5:15 PM INR slightly subtherapeutic at 1.5, advised her to either take an extra dose of coumadin tonight or to call her PCP to inquire about what to do for this, doubt need for urgent intervention. Pt feeling better and tolerating PO well. Suspect passed kidney stone with residual ureteral spasm from the hydroureteronephrosis. Will send home with pain medication and nausea medication, advised adequate hydration, f/up with urology in 1wk for recheck. Strict return precautions advised. I explained the diagnosis and have given explicit precautions to return to the ER including for any other new or worsening symptoms. The patient understands and accepts the medical plan as it's been dictated and I have answered their questions. Discharge instructions concerning home care and prescriptions have been given. The patient is STABLE and is discharged to home in good condition.    Final Clinical Impressions(s) / ED Diagnoses   Final diagnoses:  Right flank pain  Hydronephrosis, unspecified hydronephrosis type  Hydroureter  Ureteral colic  Nausea  Other microscopic hematuria  Subtherapeutic international normalized ratio (INR)    ED Discharge Orders         Ordered    HYDROcodone-acetaminophen (NORCO) 5-325 MG tablet  Every 6 hours PRN     06/02/18 1713    ondansetron (ZOFRAN ODT) 4 MG disintegrating tablet  Every 8 hours PRN     06/02/18 1713  [address], [place], [place] 06/02/18 1716    Geoffery Lyonselo, [place], MD 06/03/18 [phone]

## 2018-06-02 NOTE — Discharge Instructions (Addendum)
Your CT scan showed that you have some fluid in the kidney on the right side, but no kidney stone was found. This could indicate a recently passed kidney stone, or a blockage in the urinary tract for some other reason. Use norco as directed as needed for pain. Do not drive or operate machinery with pain medication use. May need over-the-counter stool softener with this pain medication use. Use Zofran as needed for nausea. Follow-up with the urologist in the next 1 week for recheck of ongoing pain and symptoms, however for intractable or uncontrollable symptoms at home then return to the [facility] emergency department.   Also, your INR was low today; you could take an extra dose tonight of your coumadin, or you could call your doctor to discuss what needs to be done with this finding.

## 2018-06-02 NOTE — ED Triage Notes (Signed)
Pt reports right flank pain. Pt reports she went to her PCP yesterday as she had been having blood in her urine. Her PCP told her if the pain got worse to come to the ER.  PT reports pain 10/10 with back pain

## 2019-12-04 ENCOUNTER — Other Ambulatory Visit: Payer: Self-pay | Admitting: Internal Medicine

## 2019-12-04 DIAGNOSIS — Z1231 Encounter for screening mammogram for malignant neoplasm of breast: Secondary | ICD-10-CM

## 2019-12-05 ENCOUNTER — Other Ambulatory Visit: Payer: Self-pay

## 2019-12-05 ENCOUNTER — Ambulatory Visit: Payer: BLUE CROSS/BLUE SHIELD

## 2019-12-05 ENCOUNTER — Ambulatory Visit
Admission: RE | Admit: 2019-12-05 | Discharge: 2019-12-05 | Disposition: A | Discharge status: Home / Self Care | Payer: 59 | Source: Ambulatory Visit | Attending: Internal Medicine | Admitting: Internal Medicine

## 2019-12-05 DIAGNOSIS — Z1231 Encounter for screening mammogram for malignant neoplasm of breast: Secondary | ICD-10-CM

## 2021-07-07 ENCOUNTER — Other Ambulatory Visit: Payer: Self-pay | Admitting: Internal Medicine

## 2021-07-07 DIAGNOSIS — Z1231 Encounter for screening mammogram for malignant neoplasm of breast: Secondary | ICD-10-CM

## 2021-07-20 ENCOUNTER — Ambulatory Visit
Admission: RE | Admit: 2021-07-20 | Discharge: 2021-07-20 | Disposition: A | Discharge status: Home / Self Care | Payer: 59 | Source: Ambulatory Visit | Attending: Internal Medicine | Admitting: Internal Medicine

## 2021-07-20 DIAGNOSIS — Z1231 Encounter for screening mammogram for malignant neoplasm of breast: Secondary | ICD-10-CM

## 2021-07-22 ENCOUNTER — Other Ambulatory Visit: Payer: Self-pay | Admitting: Internal Medicine

## 2021-07-22 DIAGNOSIS — N6310 Unspecified lump in the right breast, unspecified quadrant: Secondary | ICD-10-CM

## 2021-09-09 ENCOUNTER — Ambulatory Visit
Admission: RE | Admit: 2021-09-09 | Discharge: 2021-09-09 | Disposition: A | Discharge status: Home / Self Care | Payer: BC Managed Care – PPO | Source: Ambulatory Visit | Attending: Internal Medicine | Admitting: Internal Medicine

## 2021-09-09 ENCOUNTER — Ambulatory Visit
Admission: RE | Admit: 2021-09-09 | Discharge: 2021-09-09 | Disposition: A | Discharge status: Home / Self Care | Payer: BLUE CROSS/BLUE SHIELD | Source: Ambulatory Visit | Attending: Internal Medicine | Admitting: Internal Medicine

## 2021-09-09 DIAGNOSIS — N6310 Unspecified lump in the right breast, unspecified quadrant: Secondary | ICD-10-CM
